# Patient Record
Sex: Female | Born: 1950 | Race: White | Hispanic: No | Smoking: Never smoker
Health system: Southern US, Community
[De-identification: ages and names within clinical notes are randomized; demographics above are authoritative.]

## PROBLEM LIST (undated history)

## (undated) DIAGNOSIS — I10 Essential (primary) hypertension: Secondary | ICD-10-CM

## (undated) DIAGNOSIS — R51 Headache: Secondary | ICD-10-CM

## (undated) HISTORY — PX: CATARACT EXTRACTION: SUR2

## (undated) HISTORY — PX: ROTATOR CUFF REPAIR: SHX139

## (undated) HISTORY — PX: WRIST FRACTURE SURGERY: SHX121

## (undated) HISTORY — PX: CHOLECYSTECTOMY: SHX55

## (undated) HISTORY — PX: ABDOMINOPLASTY: SUR9

## (undated) HISTORY — PX: LAPAROSCOPIC GASTROTOMY W/ REPAIR OF ULCER: SUR772

## (undated) HISTORY — PX: ABDOMINAL HYSTERECTOMY: SHX81

---

## 1997-07-24 ENCOUNTER — Other Ambulatory Visit: Admission: RE | Admit: 1997-07-24 | Discharge: 1997-07-24 | Payer: Self-pay | Admitting: Obstetrics and Gynecology

## 1999-08-19 ENCOUNTER — Other Ambulatory Visit: Admission: RE | Admit: 1999-08-19 | Discharge: 1999-08-19 | Payer: Self-pay | Admitting: Gynecology

## 2002-08-23 ENCOUNTER — Other Ambulatory Visit: Admission: RE | Admit: 2002-08-23 | Discharge: 2002-08-23 | Payer: Self-pay | Admitting: Gynecology

## 2010-06-04 ENCOUNTER — Ambulatory Visit (INDEPENDENT_AMBULATORY_CARE_PROVIDER_SITE_OTHER): Payer: BC Managed Care – PPO | Admitting: Endocrinology

## 2010-06-04 ENCOUNTER — Other Ambulatory Visit: Payer: BC Managed Care – PPO

## 2010-06-04 ENCOUNTER — Encounter: Payer: Self-pay | Admitting: Endocrinology

## 2010-06-04 DIAGNOSIS — G4700 Insomnia, unspecified: Secondary | ICD-10-CM | POA: Insufficient documentation

## 2010-06-04 DIAGNOSIS — R51 Headache: Secondary | ICD-10-CM | POA: Insufficient documentation

## 2010-06-04 DIAGNOSIS — N959 Unspecified menopausal and perimenopausal disorder: Secondary | ICD-10-CM | POA: Insufficient documentation

## 2010-06-04 DIAGNOSIS — R519 Headache, unspecified: Secondary | ICD-10-CM | POA: Insufficient documentation

## 2010-06-04 DIAGNOSIS — E7800 Pure hypercholesterolemia, unspecified: Secondary | ICD-10-CM | POA: Insufficient documentation

## 2010-06-04 DIAGNOSIS — M810 Age-related osteoporosis without current pathological fracture: Secondary | ICD-10-CM

## 2010-06-04 DIAGNOSIS — K279 Peptic ulcer, site unspecified, unspecified as acute or chronic, without hemorrhage or perforation: Secondary | ICD-10-CM | POA: Insufficient documentation

## 2010-06-04 DIAGNOSIS — I10 Essential (primary) hypertension: Secondary | ICD-10-CM | POA: Insufficient documentation

## 2010-06-04 NOTE — Progress Notes (Signed)
  Subjective:    Patient ID: Alisha Becker, female    DOB: May 16, 1950, 60 y.o.   MRN: [id number]  HPI Pt says she was dx'ed with osteoporosis approx 5 year ago.  She took boniva x 2-3 years, but that did not help.  She has been on no other rx so far.   She denies early menopause, multiple myeloma, renal dz, thyroid problems, prolonged bedrest, steroids, alcoholism, smoking hx, liver dz, hereditary syndromes, vid-d deficiency, primary hyperparathyroidism, heparin, anticonvulsants.  She fx left upper arm at age 74, right wrist 4 years ago, and a toe as a teenager.  all of these were with significant injuries.  She says vit-d levels have been normal. Symptomatically, she reports many years of moderate pain at the lower back, and assoc gerd Pmh:  See prob list. Shx: [employer]. Divorced x many years. Fhx:  No osteoporosis  Review of Systems  Constitutional: Negative for unexpected weight change.  Eyes: Negative for visual disturbance.  Respiratory: Negative for shortness of breath.   Cardiovascular: Negative for chest pain.  Gastrointestinal: Negative for anal bleeding.  Skin: Negative for rash.  Neurological: Negative for syncope.  Hematological: Bruises/bleeds easily.   denies falls, cramps, memory loss, sob, fever, weight change, visual loss, and hearing loss     Objective:   Physical Exam VS: see vs page GEN: no distress HEAD: head: no deformity eyes: no periorbital swelling, no proptosis external nose and ears are normal mouth: no lesion seen NECK: supple, thyroid is not enlarged CHEST WALL: no deformity.   Specifically, no kyphosis. CV: reg rate and rhythm, no murmur ABD: abdomen is soft, nontender.  no hepatosplenomegaly.  not distended.  no hernia MUSCULOSKELETAL: muscle bulk and strength are grossly normal.  no obvious joint swelling.  gait is normal and steady EXTEMITIES: no deformity.  no ulcer on the feet.  feet are of normal color and temp.  no edema PULSES: dorsalis  pedis intact bilat.  no carotid bruit NEURO:  cn 2-12 grossly intact.   readily moves all 4's.  sensation is intact to touch on the feet SKIN:  Normal texture and temperature.  No rash or suspicious lesion is visible.   NODES:  None palpable at the neck PSYCH: alert, oriented x3.  Does not appear anxious nor depressed.     Labs: i reviewed dexas Lab Results  Component Value Date   PTH 13.3* 06/04/2010   CALCIUM 9.5 06/04/2010     Assessment & Plan:  Osteoporosis.  No secondary cause is found.   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

## 2010-06-04 NOTE — Patient Instructions (Addendum)
blood tests are being ordered for you today.  please call [phone] to hear your test results. All women are advised to take 1000 mg/day of calcium, and 1000 units/day of vitamin-d. We'll request an injection of "prolia," (a twice a year injection).  We'll call when we are ready to give to you.   Please make a follow-up appointment in 6 months.  (please call 2 weeks prior, so we can do another prior authorization). You could also consider "evista," because it will also reduce your risk of heart attack and breast cancer.

## 2010-06-05 LAB — PTH, INTACT AND CALCIUM: Calcium, Total (PTH): 9.5 mg/dL (ref 8.4–10.5)

## 2010-06-06 ENCOUNTER — Telehealth: Payer: Self-pay | Admitting: *Deleted

## 2010-06-06 LAB — PROTEIN ELECTROPHORESIS, SERUM
Albumin ELP: 58.1 % (ref 55.8–66.1)
Beta Globulin: 7.5 % — ABNORMAL HIGH (ref 4.7–7.2)
Total Protein, Serum Electrophoresis: 7.4 g/dL (ref 6.0–8.3)

## 2010-06-06 NOTE — Telephone Encounter (Signed)
Yes, she took boniva for several years, without improvement.

## 2010-06-06 NOTE — Telephone Encounter (Signed)
I am trying to get Prolia approved for pt.   Has pt failed or is she unable to tolerate at least ONE oral bisphosphanate or has contraindications to oral treatment?  Please advise so I can complete PA form

## 2010-06-07 NOTE — Telephone Encounter (Signed)
Ok, [place] form completed/faxed. Will wait for approval/denial. Copy of benefit summary is at [facility] desk in [employer]. Will in form pt of OOP once PA is complete.

## 2010-06-11 ENCOUNTER — Encounter: Payer: Self-pay | Admitting: Endocrinology

## 2010-06-13 NOTE — Telephone Encounter (Signed)
Left mess for patient to call back To inform PA is approved. Pt's OOP is $30 whether an OV is billed or not.

## 2010-06-17 NOTE — Telephone Encounter (Signed)
Pt informed. She has to check her schedule and call back to schedule nurse visit.

## 2010-06-25 ENCOUNTER — Ambulatory Visit (INDEPENDENT_AMBULATORY_CARE_PROVIDER_SITE_OTHER): Payer: BC Managed Care – PPO

## 2010-06-25 DIAGNOSIS — M810 Age-related osteoporosis without current pathological fracture: Secondary | ICD-10-CM

## 2010-06-25 MED ORDER — DENOSUMAB 60 MG/ML ~~LOC~~ SOLN
60.0000 mg | Freq: Once | SUBCUTANEOUS | Status: AC
  Administered 2010-06-25: 60 mg via SUBCUTANEOUS

## 2010-12-16 ENCOUNTER — Telehealth: Payer: Self-pay | Admitting: Endocrinology

## 2010-12-16 NOTE — Telephone Encounter (Signed)
The pt has scheduled a Proleia shot on Thursday the 15th.  Wanting to order the shot.   Thanks!

## 2010-12-19 ENCOUNTER — Ambulatory Visit: Payer: BC Managed Care – PPO

## 2011-01-17 ENCOUNTER — Ambulatory Visit (INDEPENDENT_AMBULATORY_CARE_PROVIDER_SITE_OTHER): Payer: BC Managed Care – PPO | Admitting: *Deleted

## 2011-01-17 DIAGNOSIS — M810 Age-related osteoporosis without current pathological fracture: Secondary | ICD-10-CM

## 2011-01-17 MED ORDER — DENOSUMAB 60 MG/ML ~~LOC~~ SOLN
60.0000 mg | Freq: Once | SUBCUTANEOUS | Status: AC
  Administered 2011-01-17: 60 mg via SUBCUTANEOUS

## 2011-07-04 ENCOUNTER — Encounter: Payer: Self-pay | Admitting: Endocrinology

## 2011-07-04 ENCOUNTER — Ambulatory Visit (INDEPENDENT_AMBULATORY_CARE_PROVIDER_SITE_OTHER): Payer: BC Managed Care – PPO | Admitting: Endocrinology

## 2011-07-04 VITALS — BP 142/88 | HR 89 | Temp 98.2°F | Ht 61.0 in | Wt 123.0 lb

## 2011-07-04 DIAGNOSIS — M810 Age-related osteoporosis without current pathological fracture: Secondary | ICD-10-CM

## 2011-07-04 MED ORDER — DENOSUMAB 60 MG/ML ~~LOC~~ SOLN
60.0000 mg | Freq: Once | SUBCUTANEOUS | Status: AC
  Administered 2011-07-04: 60 mg via SUBCUTANEOUS

## 2011-07-04 NOTE — Patient Instructions (Signed)
Please sign release of information for your recent blood tests at [facility].  Please return in 1 year.

## 2011-07-04 NOTE — Progress Notes (Signed)
  Subjective:    Patient ID: Alisha Becker, female    DOB: [birth year], 61 y.o.   MRN: [id number]  HPI Pt says she was dx'ed with osteoporosis approx 5 year ago.  She took boniva x 2-3 years, but that did not help.  She is due for her 3rd dose of prolia.    She says there is nothing new in her medical history.  In particular, she has no bony fx. No past medical history on file.  No past surgical history on file.  History   Social History  . Marital Status: Divorced    Spouse Name: N/A    Number of Children: N/A  . Years of Education: N/A   Occupational History  . Not on file.   Social History Main Topics  . Smoking status: Never Smoker   . Smokeless tobacco: Not on file  . Alcohol Use: Not on file  . Drug Use: Not on file  . Sexually Active: Not on file   Other Topics Concern  . Not on file   Social History Narrative  . No narrative on file    Current Outpatient Prescriptions on File Prior to Visit  Medication Sig Dispense Refill  . amitriptyline (ELAVIL) 50 MG tablet Take 50 mg by mouth at bedtime as needed.        . cloNIDine (CATAPRES) 0.1 MG tablet Take 0.1 mg by mouth at bedtime.        Marland Kitchen estradiol (ESTRACE) 1 MG tablet Take 1 mg by mouth daily.        Marland Kitchen losartan-hydrochlorothiazide (HYZAAR) 50-12.5 MG per tablet Take 1 tablet by mouth daily.       . rizatriptan (MAXALT) 10 MG tablet Take 10 mg by mouth as needed. For migraines      . simvastatin (ZOCOR) 20 MG tablet Take 20 mg by mouth at bedtime.        Marland Kitchen venlafaxine (EFFEXOR-XR) 150 MG 24 hr capsule Take 150 mg by mouth 2 (two) times daily.          Allergies  Allergen Reactions  . Codeine Nausea Only    No family history on file.  BP 142/88  Pulse 89  Temp(Src) 98.2 F (36.8 C) (Oral)  Ht 5\' 1"  (1.549 m)  Wt 123 lb (55.792 kg)  BMI 23.24 kg/m2  SpO2 98%    Review of Systems Denies falls    Objective:   Physical Exam VITAL SIGNS:  See vs page GENERAL: no distress MSK: no deformity Gait:  normal and steady       Assessment & Plan:  Osteoporosis, ready for next dose of "prolia."

## 2011-07-22 ENCOUNTER — Other Ambulatory Visit: Payer: Self-pay | Admitting: Obstetrics and Gynecology

## 2011-07-28 ENCOUNTER — Encounter (HOSPITAL_COMMUNITY): Payer: Self-pay | Admitting: Pharmacy Technician

## 2011-08-05 ENCOUNTER — Inpatient Hospital Stay (HOSPITAL_COMMUNITY): Admission: RE | Admit: 2011-08-05 | Payer: BC Managed Care – PPO | Source: Ambulatory Visit

## 2011-08-06 ENCOUNTER — Encounter (HOSPITAL_COMMUNITY): Payer: Self-pay

## 2011-08-06 ENCOUNTER — Encounter (HOSPITAL_COMMUNITY)
Admission: RE | Admit: 2011-08-06 | Discharge: 2011-08-06 | Disposition: A | Discharge status: Home / Self Care | Payer: BC Managed Care – PPO | Source: Ambulatory Visit | Attending: Obstetrics and Gynecology | Admitting: Obstetrics and Gynecology

## 2011-08-06 HISTORY — DX: Essential (primary) hypertension: I10

## 2011-08-06 HISTORY — DX: Headache: R51

## 2011-08-06 LAB — CBC
Hemoglobin: 14.3 g/dL (ref 12.0–15.0)
MCHC: 33.6 g/dL (ref 30.0–36.0)
RBC: 4.68 MIL/uL (ref 3.87–5.11)

## 2011-08-06 LAB — BASIC METABOLIC PANEL
GFR calc Af Amer: >90 mL/min (ref >90)
GFR calc non Af Amer: >90 mL/min (ref >90)
Potassium: 3.6 mEq/L (ref 3.5–5.1)
Sodium: 140 mEq/L (ref 135–145)

## 2011-08-06 NOTE — Patient Instructions (Addendum)
20 [name]  08/06/2011   Your procedure is scheduled on:  08/12/11  Enter through the Main Entrance of [facility] at 1030 AM.  Pick up the phone at the desk and dial [phone].   Call this number if you have problems the morning of surgery: [phone]   Remember:   Do not eat food:After Midnight.  Do not drink clear liquids: After Midnight.  Take these medicines the morning of surgery with A SIP OF WATER: may take Effexor   Do not wear jewelry, make-up or nail polish.  Do not wear lotions, powders, or perfumes. You may wear deodorant.  Do not shave 48 hours prior to surgery.  Do not bring valuables to the hospital.  Contacts, dentures or bridgework may not be worn into surgery.  Leave suitcase in the car. After surgery it may be brought to your room.  For patients admitted to the hospital, checkout time is 11:00 AM the day of discharge.   Patients discharged the day of surgery will not be allowed to drive home.  Name and phone number of your driver: NA  Special Instructions: CHG Shower Use Special Wash: 1/2 bottle night before surgery and 1/2 bottle morning of surgery.   Please read over the following fact sheets that you were given: Surgical Site Infection Prevention

## 2011-08-12 ENCOUNTER — Ambulatory Visit (HOSPITAL_COMMUNITY): Payer: BC Managed Care – PPO | Admitting: Anesthesiology

## 2011-08-12 ENCOUNTER — Encounter (HOSPITAL_COMMUNITY): Payer: Self-pay | Admitting: *Deleted

## 2011-08-12 ENCOUNTER — Ambulatory Visit (HOSPITAL_COMMUNITY)
Admission: RE | Admit: 2011-08-12 | Discharge: 2011-08-13 | Disposition: A | Discharge status: Home / Self Care | Payer: BC Managed Care – PPO | Source: Ambulatory Visit | Attending: Obstetrics and Gynecology | Admitting: Obstetrics and Gynecology

## 2011-08-12 ENCOUNTER — Encounter (HOSPITAL_COMMUNITY): Payer: Self-pay | Admitting: Anesthesiology

## 2011-08-12 ENCOUNTER — Encounter (HOSPITAL_COMMUNITY)
Admission: RE | Disposition: A | Discharge status: Home / Self Care | Payer: Self-pay | Source: Ambulatory Visit | Attending: Obstetrics and Gynecology

## 2011-08-12 DIAGNOSIS — Z90710 Acquired absence of both cervix and uterus: Secondary | ICD-10-CM | POA: Insufficient documentation

## 2011-08-12 DIAGNOSIS — Z01812 Encounter for preprocedural laboratory examination: Secondary | ICD-10-CM | POA: Insufficient documentation

## 2011-08-12 DIAGNOSIS — Z01818 Encounter for other preprocedural examination: Secondary | ICD-10-CM | POA: Insufficient documentation

## 2011-08-12 DIAGNOSIS — N393 Stress incontinence (female) (male): Secondary | ICD-10-CM | POA: Insufficient documentation

## 2011-08-12 HISTORY — PX: BLADDER SUSPENSION: SHX72

## 2011-08-12 HISTORY — PX: CYSTOSCOPY: SHX5120

## 2011-08-12 SURGERY — TRANSVAGINAL TAPE (TVT) PROCEDURE
Anesthesia: General | Site: Vagina | Wound class: Clean Contaminated

## 2011-08-12 MED ORDER — BUTALBITAL-APAP-CAFFEINE 50-325-40 MG PO TABS
1.0000 | ORAL_TABLET | Freq: Three times a day (TID) | ORAL | Status: DC
  Administered 2011-08-12 – 2011-08-13 (×2): 1 via ORAL
  Filled 2011-08-12 (×2): qty 1

## 2011-08-12 MED ORDER — HYDROMORPHONE HCL PF 1 MG/ML IJ SOLN: 0.2500 mg | INTRAMUSCULAR | Status: DC | PRN

## 2011-08-12 MED ORDER — INDIGOTINDISULFONATE SODIUM 8 MG/ML IJ SOLN
INTRAMUSCULAR | Status: DC | PRN
  Administered 2011-08-12: 40 mg via INTRAVENOUS

## 2011-08-12 MED ORDER — FENTANYL CITRATE 0.05 MG/ML IJ SOLN
INTRAMUSCULAR | Status: DC | PRN
  Administered 2011-08-12: 100 ug via INTRAVENOUS

## 2011-08-12 MED ORDER — LIDOCAINE HCL (CARDIAC) 20 MG/ML IV SOLN
INTRAVENOUS | Status: DC | PRN
  Administered 2011-08-12: 100 mg via INTRAVENOUS

## 2011-08-12 MED ORDER — SODIUM CHLORIDE 0.9 % IJ SOLN
INTRAMUSCULAR | Status: DC | PRN
  Administered 2011-08-12: 50 mL

## 2011-08-12 MED ORDER — PHENYLEPHRINE HCL 10 MG/ML IJ SOLN
INTRAMUSCULAR | Status: DC | PRN
  Administered 2011-08-12 (×3): 40 ug via INTRAVENOUS

## 2011-08-12 MED ORDER — KETOROLAC TROMETHAMINE 30 MG/ML IJ SOLN
INTRAMUSCULAR | Status: DC | PRN
  Administered 2011-08-12: 30 mg via INTRAVENOUS

## 2011-08-12 MED ORDER — MIDAZOLAM HCL 2 MG/2ML IJ SOLN
INTRAMUSCULAR | Status: AC
  Filled 2011-08-12: qty 2

## 2011-08-12 MED ORDER — STERILE WATER FOR IRRIGATION IR SOLN
Status: DC | PRN
  Administered 2011-08-12: 1000 mL via INTRAVESICAL

## 2011-08-12 MED ORDER — HYDRALAZINE HCL 20 MG/ML IJ SOLN
INTRAMUSCULAR | Status: AC
  Filled 2011-08-12: qty 1

## 2011-08-12 MED ORDER — DEXAMETHASONE SODIUM PHOSPHATE 10 MG/ML IJ SOLN
INTRAMUSCULAR | Status: AC
  Filled 2011-08-12: qty 1

## 2011-08-12 MED ORDER — ONDANSETRON HCL 4 MG/2ML IJ SOLN
INTRAMUSCULAR | Status: DC | PRN
  Administered 2011-08-12: 4 mg via INTRAVENOUS

## 2011-08-12 MED ORDER — INDIGOTINDISULFONATE SODIUM 8 MG/ML IJ SOLN
INTRAMUSCULAR | Status: AC
  Filled 2011-08-12: qty 5

## 2011-08-12 MED ORDER — ONDANSETRON HCL 4 MG/2ML IJ SOLN
INTRAMUSCULAR | Status: AC
  Filled 2011-08-12: qty 2

## 2011-08-12 MED ORDER — VENLAFAXINE HCL ER 150 MG PO CP24
150.0000 mg | ORAL_CAPSULE | Freq: Every day | ORAL | Status: DC
  Administered 2011-08-12: 150 mg via ORAL
  Filled 2011-08-12 (×2): qty 1

## 2011-08-12 MED ORDER — CLONIDINE HCL 0.1 MG PO TABS
0.1000 mg | ORAL_TABLET | Freq: Every day | ORAL | Status: DC
Start: 2011-08-12 — End: 2011-08-13
  Administered 2011-08-12: 0.1 mg via ORAL
  Filled 2011-08-12 (×2): qty 1

## 2011-08-12 MED ORDER — LACTATED RINGERS IV SOLN
INTRAVENOUS | Status: DC
  Administered 2011-08-12: 11:00:00 via INTRAVENOUS

## 2011-08-12 MED ORDER — PROPOFOL 10 MG/ML IV EMUL
INTRAVENOUS | Status: AC
  Filled 2011-08-12: qty 20

## 2011-08-12 MED ORDER — GLYCOPYRROLATE 0.2 MG/ML IJ SOLN
INTRAMUSCULAR | Status: AC
  Filled 2011-08-12: qty 1

## 2011-08-12 MED ORDER — LIDOCAINE-EPINEPHRINE 1 %-1:100000 IJ SOLN
INTRAMUSCULAR | Status: DC | PRN
  Administered 2011-08-12: 20 mL

## 2011-08-12 MED ORDER — GLYCOPYRROLATE 0.2 MG/ML IJ SOLN
INTRAMUSCULAR | Status: DC | PRN
  Administered 2011-08-12: 0.1 mg via INTRAVENOUS

## 2011-08-12 MED ORDER — VENLAFAXINE HCL ER 150 MG PO CP24
150.0000 mg | ORAL_CAPSULE | Freq: Every day | ORAL | Status: DC
  Filled 2011-08-12: qty 1

## 2011-08-12 MED ORDER — PROPOFOL 10 MG/ML IV BOLUS
INTRAVENOUS | Status: DC | PRN
  Administered 2011-08-12: 200 mg via INTRAVENOUS

## 2011-08-12 MED ORDER — CEFAZOLIN SODIUM-DEXTROSE 2-3 GM-% IV SOLR
2.0000 g | INTRAVENOUS | Status: AC
  Administered 2011-08-12: 2 g via INTRAVENOUS

## 2011-08-12 MED ORDER — LIDOCAINE HCL (CARDIAC) 20 MG/ML IV SOLN
INTRAVENOUS | Status: AC
  Filled 2011-08-12: qty 5

## 2011-08-12 MED ORDER — HYDROMORPHONE HCL 2 MG PO TABS
2.0000 mg | ORAL_TABLET | ORAL | Status: DC | PRN
  Administered 2011-08-12 – 2011-08-13 (×5): 2 mg via ORAL
  Filled 2011-08-12 (×5): qty 1

## 2011-08-12 MED ORDER — MIDAZOLAM HCL 5 MG/5ML IJ SOLN
INTRAMUSCULAR | Status: DC | PRN
  Administered 2011-08-12: 2 mg via INTRAVENOUS

## 2011-08-12 MED ORDER — FENTANYL CITRATE 0.05 MG/ML IJ SOLN
INTRAMUSCULAR | Status: AC
  Filled 2011-08-12: qty 2

## 2011-08-12 MED ORDER — DEXTROSE-NACL 5-0.45 % IV SOLN
INTRAVENOUS | Status: DC
  Administered 2011-08-12 – 2011-08-13 (×2): via INTRAVENOUS

## 2011-08-12 MED ORDER — RIZATRIPTAN BENZOATE 10 MG PO TABS
10.0000 mg | ORAL_TABLET | Freq: Once | ORAL | Status: AC | PRN
  Administered 2011-08-12: 10 mg via ORAL

## 2011-08-12 MED ORDER — DEXAMETHASONE SODIUM PHOSPHATE 10 MG/ML IJ SOLN
INTRAMUSCULAR | Status: DC | PRN
  Administered 2011-08-12: 10 mg via INTRAVENOUS

## 2011-08-12 SURGICAL SUPPLY — 33 items
BLADE SURG 15 STRL LF C SS BP (BLADE) ×2 IMPLANT
BLADE SURG 15 STRL SS (BLADE) ×1
CANISTER SUCTION 2500CC (MISCELLANEOUS) ×3 IMPLANT
CATH FOLEY 2WAY SLVR  5CC 18FR (CATHETERS)
CATH FOLEY 2WAY SLVR 5CC 18FR (CATHETERS) IMPLANT
CATH ROBINSON RED A/P 16FR (CATHETERS) IMPLANT
CLOTH BEACON ORANGE TIMEOUT ST (SAFETY) ×3 IMPLANT
COUNTER NEEDLE 1200 MAGNETIC (NEEDLE) ×3 IMPLANT
DECANTER SPIKE VIAL GLASS SM (MISCELLANEOUS) ×3 IMPLANT
DERMABOND ADHESIVE PROPEN (GAUZE/BANDAGES/DRESSINGS) ×1
DERMABOND ADVANCED .7 DNX6 (GAUZE/BANDAGES/DRESSINGS) ×2 IMPLANT
DRAPE HYSTEROSCOPY (DRAPE) ×3 IMPLANT
GAUZE PACKING 2X5 YD STERILE (GAUZE/BANDAGES/DRESSINGS) IMPLANT
GAUZE PACKING IODOFORM 2 (PACKING) IMPLANT
GLOVE ECLIPSE 7.0 STRL STRAW (GLOVE) ×6 IMPLANT
GOWN PREVENTION PLUS LG XLONG (DISPOSABLE) ×6 IMPLANT
GOWN STRL REIN XL XLG (GOWN DISPOSABLE) ×3 IMPLANT
NEEDLE ASPIRATION INJECT 22GA (NEEDLE) ×3 IMPLANT
NEEDLE SPNL 18GX3.5 QUINCKE PK (NEEDLE) ×3 IMPLANT
NS IRRIG 1000ML POUR BTL (IV SOLUTION) ×3 IMPLANT
PACK VAGINAL MINOR WOMEN LF (CUSTOM PROCEDURE TRAY) ×3 IMPLANT
PACK VAGINAL WOMENS (CUSTOM PROCEDURE TRAY) IMPLANT
PENCIL BUTTON HOLSTER BLD 10FT (ELECTRODE) ×3 IMPLANT
SET CYSTO W/LG BORE CLAMP LF (SET/KITS/TRAYS/PACK) ×3 IMPLANT
SUT VIC AB 2-0 CTB1 (SUTURE) ×6 IMPLANT
SYR 20CC LL (SYRINGE) ×3 IMPLANT
TOWEL OR 17X24 6PK STRL BLUE (TOWEL DISPOSABLE) ×6 IMPLANT
TRAY FOLEY CATH 14FR (SET/KITS/TRAYS/PACK) IMPLANT
TRAY FOLEY METER SIL LF 16FR (CATHETERS) ×3 IMPLANT
TUBING SUCTION BULK 100 FT (MISCELLANEOUS) ×3 IMPLANT
TVT Exact (Sling) ×3 IMPLANT
WATER STERILE IRR 1000ML POUR (IV SOLUTION) ×3 IMPLANT
YANKAUER SUCT BULB TIP NO VENT (SUCTIONS) ×3 IMPLANT

## 2011-08-12 NOTE — Anesthesia Preprocedure Evaluation (Signed)
Anesthesia Evaluation  Patient identified by MRN, date of birth, ID band Patient awake    Reviewed: Allergy & Precautions, H&P , Patient's Chart, lab work & pertinent test results, reviewed documented beta blocker date and time   Airway Mallampati: II TM Distance: >3 FB Neck ROM: full    Dental No notable dental hx.    Pulmonary  breath sounds clear to auscultation  Pulmonary exam normal       Cardiovascular hypertension, Pt. on medications Rhythm:regular Rate:Normal     Neuro/Psych    GI/Hepatic   Endo/Other    Renal/GU      Musculoskeletal   Abdominal   Peds  Hematology   Anesthesia Other Findings   Reproductive/Obstetrics                           Anesthesia Physical Anesthesia Plan  ASA: II  Anesthesia Plan: General   Post-op Pain Management:    Induction: Intravenous  Airway Management Planned: LMA  Additional Equipment:   Intra-op Plan:   Post-operative Plan:   Informed Consent: I have reviewed the patients History and Physical, chart, labs and discussed the procedure including the risks, benefits and alternatives for the proposed anesthesia with the patient or authorized representative who has indicated his/her understanding and acceptance.   Dental Advisory Given  Plan Discussed with: CRNA and Surgeon  Anesthesia Plan Comments: (  Discussed  general anesthesia, including possible nausea, instrumentation of airway, sore throat,pulmonary aspiration, etc. I asked if the were any outstanding questions, or  concerns before we proceeded. )        Anesthesia Quick Evaluation

## 2011-08-12 NOTE — Anesthesia Postprocedure Evaluation (Signed)
  Anesthesia Post-op Note  Patient: Alisha Becker  Procedure(s) Performed: Procedure(s) (LRB): TRANSVAGINAL TAPE (TVT) PROCEDURE (N/A) CYSTOSCOPY (N/A)  Patient Location: PACU  Anesthesia Type: General  Level of Consciousness: awake, alert  and oriented  Airway and Oxygen Therapy: Patient Spontanous Breathing  Post-op Pain: none  Post-op Assessment: Post-op Vital signs reviewed, Patient's Cardiovascular Status Stable, Respiratory Function Stable, Patent Airway, No signs of Nausea or vomiting and Pain level controlled  Post-op Vital Signs: Reviewed and stable  Complications: No apparent anesthesia complications

## 2011-08-12 NOTE — Transfer of Care (Signed)
Immediate Anesthesia Transfer of Care Note  Patient: Alisha Becker  Procedure(s) Performed: Procedure(s) (LRB): TRANSVAGINAL TAPE (TVT) PROCEDURE (N/A) CYSTOSCOPY (N/A)  Patient Location: PACU  Anesthesia Type: General  Level of Consciousness: awake, alert  and oriented  Airway & Oxygen Therapy: Patient Spontanous Breathing  Post-op Assessment: Report given to PACU RN and Post -op Vital signs reviewed and stable  Post vital signs: Reviewed and stable  Complications: No apparent anesthesia complications

## 2011-08-12 NOTE — Op Note (Signed)
NAMELOYAL, RUDY NO.:  [id number]  MEDICAL RECORD NO.:  [id number]  LOCATION:  9309                          FACILITY:  [facility]  PHYSICIAN:  Malva Limes, M.D.    DATE OF BIRTH:  Nov 30, 1950  DATE OF PROCEDURE:  08/12/2011 DATE OF DISCHARGE:                              OPERATIVE REPORT   PREOPERATIVE DIAGNOSIS:  Stress urinary incontinence.  POSTOPERATIVE DIAGNOSIS:  Stress urinary incontinence.  PROCEDURE: 1. TVT urethropexy. 2. Cystoscopy.  SURGEON:  Malva Limes, MD  ANESTHESIA:  General with local.  ANTIBIOTICS:  Ancef 2 g.  ESTIMATED BLOOD LOSS:  30 mL.  SPECIMENS:  None.  COMPLICATIONS:  None.  PROCEDURE:  The patient was taken to the operating room, where she was placed in the dorsal supine position.  A general anesthetic was administered without difficulty.  She was then placed in dorsal lithotomy position.  She was prepped and draped in the usual fashion for this procedure.  A Foley catheter was placed into her bladder.  Her suprapubic region was marked for exit sites of the TVT Exact.  Following this, the suprapubic region was hydrodissected with 30 mL of a dilute lidocaine solution on each side and also in the anterior vagina.  At this point, a vertical skin incision was made in the anterior vaginal wall approximately 1 cm from the urethral meatus.  At this point, the Foley catheter was removed and replaced with a larger Foley with a metal rod, diverting rod placed through it.  At this point, passage of the TVT Exact was begun on the patient's right.  It was advanced through the incision in the vagina and then into the retropubic space exiting through the skin.  After this, the urethra and bladder were diverted in the opposite direction and the patient's left side at the TVT Exact placed through the retropubic region.  At this point, Foley catheter was removed.  Cystoscopy was performed.  The entire bladder was visualized. There was  no evidence of foreign material in the bladder.  Bladder dome was seen.  Both ureters were seen and were functioning properly.  At this point, the cystoscope was removed.  The protective covering on the mesh was then removed.  The TVT tape was tightened with a Kelly between the mesh and the urethra.  Excess mesh was trimmed off.  Incisions in the suprapubic region were closed with Dermabond.  The vagina was closed using 2-0 Vicryl in a running, locking fashion.  The patient was then awakened and taken to recovery room in stable condition.  She did have a Foley catheter placed.  Instrument and lap counts were correct x1.  The patient will be admitted overnight for observation.          ______________________________ Malva Limes, M.D.     MA/MEDQ  D:  08/12/2011  T:  08/12/2011  Job:  [id number]

## 2011-08-12 NOTE — OR Nursing (Signed)
1228 TVT Exact sling implanted @ midurethral position in  OR suite per Dr. Judie Petit. [place]. Serial # [id number]. Exp. Date 04-03-12.

## 2011-08-12 NOTE — H&P (Signed)
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 Pt has no symptoms of urge incontinence. She understands the risks and possible complications of this procedure. She has no cystocele and normal bowel function.  PMHX: Allergy-codeine             Surgery- TVH, SVD x 2             Smoke-no, Drugs-no , meds- see list PE- VSSAF         HEENT-wnl        ABD- soft, non tender, no masses IMP/ SUI PLAN/ Proceed with TVT

## 2011-08-12 NOTE — Anesthesia Procedure Notes (Signed)
Procedure Name: LMA Insertion Date/Time: 08/12/2011 12:11 PM Performed by: Wyat Infinger, Jannet Askew Pre-anesthesia Checklist: Patient identified, Timeout performed, Emergency Drugs available, Suction available and Patient being monitored Patient Re-evaluated:Patient Re-evaluated prior to inductionOxygen Delivery Method: Circle system utilized Preoxygenation: Pre-oxygenation with 100% oxygen Intubation Type: IV induction LMA: LMA inserted LMA Size: 4.0 Number of attempts: 1 Tube secured with: Tape (at lip) Dental Injury: Teeth and Oropharynx as per pre-operative assessment

## 2011-08-13 ENCOUNTER — Encounter (HOSPITAL_COMMUNITY): Payer: Self-pay | Admitting: Obstetrics and Gynecology

## 2011-08-13 NOTE — Progress Notes (Signed)
Pt. Is discharged in the care of son per ambulatory. Stable. Denies any further pain or discomfort.Marland Kitchen Spirits are good Supra pubic  Sites are clean and dry.  Denies vaginal bleeding. Understands all discharge instructions well. Questions asked and answered.

## 2011-08-13 NOTE — Discharge Summary (Signed)
Alisha Becker, Alisha Becker NO.:  [id number]  MEDICAL RECORD NO.:  [id number]  LOCATION:  9309                          FACILITY:  [facility]  PHYSICIAN:  Malva Limes, M.D.    DATE OF BIRTH:  29-Feb-1950  DATE OF ADMISSION:  08/12/2011 DATE OF DISCHARGE:  08/14/2011                              DISCHARGE SUMMARY   DISCHARGE DIAGNOSIS:  Stress urinary incontinence.  PRINCIPLE PROCEDURES:  PVT urethropexy.  HISTORY OF PRESENT ILLNESS:  [name] is a 62 year old white female, G3, P2, status post TVH who presented to [facility] on August 13, 2011, for urethral sling secondary to a long history of classic stress urinary incontinence.  HOSPITAL COURSE:  The patient was taken to the operating room on August 13, 2011, where a TVT Exact was placed.  Cystoscopy was performed.  The patient did well.  A complete description of this procedure can be found in dictated operative note.  Postoperatively, the patient did well.  At the time of discharge, she had adequate pain relief.  Her incision appeared to be healing well.  She was ambulating without difficulty. She had a voiding trial, which she passed.  The patient was discharged home with Dilaudid.  She was instructed to follow up in the office in 2 weeks.  She was told to call the office with any voiding difficulties, fever, or chills.          ______________________________ Malva Limes, M.D.     MA/MEDQ  D:  08/13/2011  T:  08/13/2011  Job:  [id number]

## 2011-08-13 NOTE — Progress Notes (Signed)
POD#1 Pt c/o discomfort from surgery. Tolerating diet, VSSAF IMP/ stable Plan/ Will d/c foley and start voiding trial.

## 2011-08-13 NOTE — Anesthesia Postprocedure Evaluation (Signed)
  Anesthesia Post-op Note  Patient: Alisha Becker  Procedure(s) Performed: Procedure(s) (LRB): TRANSVAGINAL TAPE (TVT) PROCEDURE (N/A) CYSTOSCOPY (N/A)  Patient Location: [facility] Unit  Anesthesia Type: General  Level of Consciousness: awake  Airway and Oxygen Therapy: Patient Spontanous Breathing  Post-op Pain: none  Post-op Assessment: Patient's Cardiovascular Status Stable and Respiratory Function Stable  Post-op Vital Signs: Reviewed and stable  Complications: No apparent anesthesia complications

## 2011-08-13 NOTE — Addendum Note (Signed)
Addendum  created 08/13/11 1024 by Suella Grove, CRNA   Modules edited:Notes Section

## 2011-12-19 ENCOUNTER — Ambulatory Visit: Payer: BC Managed Care – PPO | Admitting: Endocrinology

## 2012-01-09 ENCOUNTER — Ambulatory Visit (INDEPENDENT_AMBULATORY_CARE_PROVIDER_SITE_OTHER): Payer: BC Managed Care – PPO

## 2012-01-09 DIAGNOSIS — M810 Age-related osteoporosis without current pathological fracture: Secondary | ICD-10-CM

## 2012-01-12 MED ORDER — DENOSUMAB 60 MG/ML ~~LOC~~ SOLN: 60.0000 mg | Freq: Once | SUBCUTANEOUS | Status: DC

## 2012-10-05 ENCOUNTER — Telehealth: Payer: Self-pay | Admitting: Endocrinology

## 2012-10-05 NOTE — Telephone Encounter (Signed)
Left messge pt to make appt for injection

## 2012-10-15 ENCOUNTER — Ambulatory Visit: Payer: BC Managed Care – PPO

## 2012-10-15 ENCOUNTER — Telehealth: Payer: Self-pay

## 2012-10-15 NOTE — Telephone Encounter (Signed)
Pt would came in for Prolia injection today would like to know when she should have another bone scan and can she have it done in [place]

## 2012-10-15 NOTE — Telephone Encounter (Signed)
Left message

## 2012-10-15 NOTE — Telephone Encounter (Signed)
please call patient: When you have your next prolia shot, please have dr appt here the same day.

## 2013-05-23 ENCOUNTER — Ambulatory Visit (INDEPENDENT_AMBULATORY_CARE_PROVIDER_SITE_OTHER): Payer: BC Managed Care – PPO | Admitting: Endocrinology

## 2013-05-23 ENCOUNTER — Encounter: Payer: Self-pay | Admitting: Endocrinology

## 2013-05-23 VITALS — BP 142/88 | HR 75 | Temp 98.0°F | Ht 61.0 in | Wt 120.0 lb

## 2013-05-23 DIAGNOSIS — M25559 Pain in unspecified hip: Secondary | ICD-10-CM

## 2013-05-23 DIAGNOSIS — M810 Age-related osteoporosis without current pathological fracture: Secondary | ICD-10-CM

## 2013-05-23 DIAGNOSIS — M25552 Pain in left hip: Secondary | ICD-10-CM

## 2013-05-23 DIAGNOSIS — M25549 Pain in joints of unspecified hand: Secondary | ICD-10-CM

## 2013-05-23 DIAGNOSIS — M25579 Pain in unspecified ankle and joints of unspecified foot: Secondary | ICD-10-CM

## 2013-05-23 DIAGNOSIS — M25572 Pain in left ankle and joints of left foot: Secondary | ICD-10-CM

## 2013-05-23 DIAGNOSIS — M25542 Pain in joints of left hand: Secondary | ICD-10-CM

## 2013-05-23 NOTE — Patient Instructions (Signed)
blood tests are being requested for you today.  We'll contact you with results. [facility]  you will receive a phone call, about a day and time for an appointment. We'll call you when we receive the prior authorization for prolia.   Please return in 1 year.

## 2013-05-23 NOTE — Progress Notes (Signed)
Subjective:    Patient ID: Alisha Becker, female    DOB: Dec 09, 1950, 63 y.o.   MRN: [id number]  HPI Pt returns fopr f/u of osteoporosis (dx'ed 2007; she took boniva x from 2008-2010, but that did not help; she started prolia in 2011, and is still on it; she had fx toe as a child, and left humerus in 1980 (both with injuries); she has no history of any of the following: early menopause, multiple myeloma, renal dz, thyroid problems, prolonged bedrest, steroids, alcoholism, smoking, liver dz, hereditary syndromes, vid-d deficiency, primary hyperparathyroidism, heparin, or anticonvulsants; fhx is neg for osteoporosis).  Pt says she got her last prolia injection in 2014, despite no record in EMR of this.  Pt reports moderate pain at the left hip, and assoc pain at the left ankle. Past Medical History  Diagnosis Date  . Hypertension   . YIFOYDXA(128.7)     Past Surgical History  Procedure Laterality Date  . Vaginal delivery      x2  . Wrist fracture surgery    . Laparoscopic gastrotomy w/ repair of ulcer    . Abdominoplasty    . Bladder suspension  08/12/2011    Procedure: TRANSVAGINAL TAPE (TVT) PROCEDURE;  Surgeon: Olga Millers, MD;  Location: [facility];  Service: Gynecology;  Laterality: N/A;  . Cystoscopy  08/12/2011    Procedure: CYSTOSCOPY;  Surgeon: Olga Millers, MD;  Location: [facility];  Service: Gynecology;  Laterality: N/A;    History   Social History  . Marital Status: Divorced    Spouse Name: N/A    Number of Children: N/A  . Years of Education: N/A   Occupational History  . Not on file.   Social History Main Topics  . Smoking status: Never Smoker   . Smokeless tobacco: Not on file  . Alcohol Use: Yes     Comment: occasionally  . Drug Use: No  . Sexual Activity: Not on file   Other Topics Concern  . Not on file   Social History Narrative  . No narrative on file    Current Outpatient Prescriptions on File Prior to Visit  Medication Sig Dispense Refill  .  amitriptyline (ELAVIL) 50 MG tablet Take 50 mg by mouth at bedtime.       Marland Kitchen BIOTIN PO Take 10,000 mcg by mouth 3 (three) times daily.      . Calcium-Vitamin D-Vitamin K (VIACTIV) [phone] MG-UNT-MCG CHEW Chew 1 each by mouth 3 (three) times daily.      . cholecalciferol (VITAMIN D) 1000 UNITS tablet Take 1,000 Units by mouth 3 (three) times daily.      . cloNIDine (CATAPRES) 0.1 MG tablet Take 0.1 mg by mouth at bedtime.        . COLLAGEN PO Take 750 mg by mouth 3 (three) times daily.      . Cyanocobalamin (VITAMIN B-12) 5000 MCG SUBL Place 1 tablet under the tongue daily.      Marland Kitchen estradiol (ESTRACE) 1 MG tablet Take 1 mg by mouth daily.        . Evening Primrose Oil 1000 MG CAPS Take 1 capsule by mouth daily.      Marland Kitchen losartan-hydrochlorothiazide (HYZAAR) 50-12.5 MG per tablet Take 1 tablet by mouth daily.       . Magnesium 400 MG CAPS Take 1 capsule by mouth 3 (three) times daily.      . Multiple Vitamin (MULTIVITAMIN WITH MINERALS) TABS Take 0.5 tablets by mouth 2 (two) times  daily.      Marland Kitchen OVER THE COUNTER MEDICATION Take 1 tablet by mouth daily. LUTIN supplement      . PATADAY 0.2 % SOLN Place 1 drop into both eyes daily.       Marland Kitchen venlafaxine (EFFEXOR-XR) 150 MG 24 hr capsule Take 150 mg by mouth daily.       . vitamin C (ASCORBIC ACID) 500 MG tablet Take 500 mg by mouth daily.      . Coenzyme Q10 (COQ10) 100 MG CAPS Take 1 capsule by mouth daily.       Current Facility-Administered Medications on File Prior to Visit  Medication Dose Route Frequency Provider Last Rate Last Dose  . denosumab (PROLIA) injection 60 mg  60 mg Subcutaneous Once Renato Shin, MD        Allergies  Allergen Reactions  . Aspirin Other (See Comments)    Pt has history of ulcers  . Codeine Nausea Only  . Latex Rash    No family history on file.  BP 142/88  Pulse 75  Temp(Src) 98 F (36.7 C) (Oral)  Ht _0  (1.549 m)  Wt 120 lb (54.432 kg)  BMI 22.69 kg/m2  SpO2 92%  Review of Systems Denies falls  and LOC.    Objective:   Physical Exam VITAL SIGNS:  See vs page GENERAL: no distress MSK: no deformity.  Gait is normal and steady.  Hands: bilat heberden's nodes at the dip's. Left heel: large spur.       Assessment & Plan:  Osteoporosis: she is due for recheck of her DEXA. Arthralgias: uncertain etiology.  Not related to osteoporosis.

## 2013-05-24 LAB — SEDIMENTATION RATE: Sed Rate: 15 mm/hr (ref 0–22)

## 2013-05-24 LAB — TSH: TSH: 1.37 u[IU]/mL (ref 0.35–5.50)

## 2013-05-25 LAB — PTH, INTACT AND CALCIUM
Calcium: 9.6 mg/dL (ref 8.4–10.5)
PTH: 21.5 pg/mL (ref 14.0–72.0)

## 2013-05-25 LAB — VITAMIN D 25 HYDROXY (VIT D DEFICIENCY, FRACTURES): VIT D 25 HYDROXY: 54 ng/mL (ref 30–89)

## 2013-05-26 ENCOUNTER — Telehealth: Payer: Self-pay | Admitting: Endocrinology

## 2013-05-26 NOTE — Telephone Encounter (Signed)
Patient would like a copy of her last labs mailed to her. Thanks!

## 2013-05-26 NOTE — Telephone Encounter (Signed)
Labs mailed per pt's request.

## 2013-06-03 ENCOUNTER — Telehealth: Payer: Self-pay | Admitting: Endocrinology

## 2013-06-03 NOTE — Telephone Encounter (Signed)
Sent request for insurance verification to [employer].  I will notify you once I have a response from them. Thank you.

## 2013-06-14 NOTE — Telephone Encounter (Signed)
I received a response back from [employer] and patient's insurance requires a prior authorization.  I will request [employer] to handle the P/A, but they will ask for clinical notes so if you could go ahead and fax over the clinicals I will get them started on the P/A.  They said turn around time is 72 hours.  You can fax clinicals to me at [phone] and please be sure to put to my attn.  Thank you!

## 2013-06-17 NOTE — Telephone Encounter (Signed)
Would that be from the visit of 05/23/2013? I can print if I know which office visit to print. Thank you.

## 2013-06-17 NOTE — Telephone Encounter (Signed)
Yes, ov on 05/23/13. Thank you for your hard work.

## 2013-06-17 NOTE — Telephone Encounter (Signed)
Rose, are you able to go into the patients chart and print her last ov note from her visit with Dr Everardo AllEllison? If not, then I will fax it to you.

## 2013-06-17 NOTE — Telephone Encounter (Signed)
Ok, I have printed that and am waiting on [employer] to send the P/A form which will need to be signed by Dr. Rennis HardingEllis. I will get it to you as soon as I receive.  And thank you for all you do.

## 2013-07-06 ENCOUNTER — Telehealth: Payer: Self-pay | Admitting: Endocrinology

## 2013-07-06 NOTE — Telephone Encounter (Signed)
Faxed [employer]'s prior authorization form to you for Dr. Everardo All to complete. Return to me at [phone] once complete. Thank you so much!

## 2013-07-13 NOTE — Telephone Encounter (Signed)
Faxed completed [employer] prior auth form and clinicals to [facility] for approval 07/12/2013. Thank you.

## 2013-07-13 NOTE — Telephone Encounter (Signed)
I faxed the copies to you but the line was busy. It went into re-dial mode, so if you don't get them, please let me know. Thank you.

## 2013-07-13 NOTE — Telephone Encounter (Signed)
Will the pt need to pay a co-pay? Please advise.

## 2013-07-13 NOTE — Telephone Encounter (Signed)
Can you fax me a copy of that? [phone]. Thank you.

## 2013-07-13 NOTE — Telephone Encounter (Signed)
If an office visit is billed patient will have a $70 co-pay; w/out an office visit Prolia is covered at 100% of the contracted rate. I have made a copy of this info and sent to be scanned into pt's chart.  However, we do not have authorization yet and are waiting on that. If you have any questions please let me know. Thanks!

## 2013-07-27 ENCOUNTER — Telehealth: Payer: Self-pay | Admitting: *Deleted

## 2013-07-27 NOTE — Telephone Encounter (Signed)
Called pt and lvm advising her that her Prolia inj has been approved. She can call our office and schedule this at her convenience.

## 2013-08-01 ENCOUNTER — Telehealth: Payer: Self-pay

## 2013-08-01 NOTE — Telephone Encounter (Signed)
i need the report back to read numbers, thank you

## 2013-08-01 NOTE — Telephone Encounter (Signed)
Called pt and advised of bone density results. Pt wanted to know specifically how the prolia injections were helping and how significant the number has improved. Please advise, Thanks!

## 2013-08-02 ENCOUNTER — Ambulatory Visit (INDEPENDENT_AMBULATORY_CARE_PROVIDER_SITE_OTHER): Payer: BC Managed Care – PPO

## 2013-08-02 DIAGNOSIS — M810 Age-related osteoporosis without current pathological fracture: Secondary | ICD-10-CM

## 2013-08-02 MED ORDER — DENOSUMAB 60 MG/ML ~~LOC~~ SOLN
60.0000 mg | Freq: Once | SUBCUTANEOUS | Status: AC
  Administered 2013-08-02: 60 mg via SUBCUTANEOUS

## 2013-08-02 NOTE — Telephone Encounter (Signed)
Results placed on desk

## 2013-08-02 NOTE — Telephone Encounter (Signed)
please call patient: 2.5 improved to 2.3 1.8 improved to 1.4 0.7 improved to 0.4 (lower is better, as these numbers are how much lower than normal your bone-density is)

## 2013-08-04 NOTE — Telephone Encounter (Signed)
Noted pt. advised

## 2013-10-04 DIAGNOSIS — L84 Corns and callosities: Secondary | ICD-10-CM

## 2014-02-20 ENCOUNTER — Telehealth: Payer: Self-pay | Admitting: *Deleted

## 2014-02-20 NOTE — Telephone Encounter (Signed)
Rose, will you initiate a PA for Prolia for this pt? Thank you.

## 2014-02-23 NOTE — Telephone Encounter (Signed)
Could you please get updated insurance info for pt? [facility] 08/02/2013. Thank you.

## 2014-03-03 ENCOUNTER — Encounter: Payer: Self-pay | Admitting: *Deleted

## 2014-03-03 NOTE — Telephone Encounter (Signed)
Lvm for pt to call (on 02/24/14) and let us know about her insurance. Pt did not return call. Letter out today to pt.

## 2014-04-04 ENCOUNTER — Telehealth: Payer: Self-pay | Admitting: *Deleted

## 2014-04-04 NOTE — Telephone Encounter (Signed)
Her new ins information is in the system now. Please move forward with doing the PA for Prolia. Thank you.

## 2014-04-05 NOTE — Telephone Encounter (Signed)
I have sent pt's info for Prolia insurance verification and will notify you once I have a response. Thank you. °

## 2014-04-05 NOTE — Telephone Encounter (Signed)
Opened this encounter in error.

## 2014-04-20 NOTE — Telephone Encounter (Signed)
Pt's [employer] is requiring a prior auth for Prolia.  I have completed part of the form, but the other part needs to be completed by Dr. Everardo AllEllison and signed.  I have faxed the form to your attn at [phone]. Once it is completed/signed you can return via fax to me at [phone]. Thank you.

## 2014-05-09 ENCOUNTER — Ambulatory Visit (INDEPENDENT_AMBULATORY_CARE_PROVIDER_SITE_OTHER): Payer: Medicare PPO | Admitting: Endocrinology

## 2014-05-09 ENCOUNTER — Encounter: Payer: Self-pay | Admitting: Endocrinology

## 2014-05-09 VITALS — BP 136/84 | HR 78 | Temp 97.8°F | Ht 61.0 in | Wt 118.0 lb

## 2014-05-09 DIAGNOSIS — M810 Age-related osteoporosis without current pathological fracture: Secondary | ICD-10-CM | POA: Diagnosis not present

## 2014-05-09 NOTE — Progress Notes (Signed)
Subjective:    Patient ID: Alisha Becker, female    DOB: August 15, 1950, 63 y.o.   MRN: [id number]  HPI Pt returns for f/u of osteoporosis (dx'ed 2007; she took boniva x from 2008-2010, but that did not help; she started prolia in 2011, and is still on it; she had fx toe as a child, and left humerus in 1980 (both with injuries); she has no history of any of the following: early menopause, multiple myeloma, renal dz, thyroid problems, prolonged bedrest, steroids, alcoholism, smoking, liver dz, hereditary syndromes, vid-d deficiency, primary hyperparathyroidism, heparin, or anticonvulsants; fhx is neg for osteoporosis).  pt states she feels well in general. No recent bony fx. She has tolerated prolia well.   Past Medical History  Diagnosis Date  . Hypertension   . [id number](829.9)     Past Surgical History  Procedure Laterality Date  . Vaginal delivery      x2  . Wrist fracture surgery    . Laparoscopic gastrotomy w/ repair of ulcer    . Abdominoplasty    . Bladder suspension  08/12/2011    Procedure: TRANSVAGINAL TAPE (TVT) PROCEDURE;  Surgeon: Olga Millers, MD;  Location: [facility];  Service: Gynecology;  Laterality: N/A;  . Cystoscopy  08/12/2011    Procedure: CYSTOSCOPY;  Surgeon: Olga Millers, MD;  Location: [facility];  Service: Gynecology;  Laterality: N/A;    History   Social History  . Marital Status: Divorced    Spouse Name: N/A  . Number of Children: N/A  . Years of Education: N/A   Occupational History  . Not on file.   Social History Main Topics  . Smoking status: Never Smoker   . Smokeless tobacco: Not on file  . Alcohol Use: Yes     Comment: occasionally  . Drug Use: No  . Sexual Activity: Not on file   Other Topics Concern  . Not on file   Social History Narrative    Current Outpatient Prescriptions on File Prior to Visit  Medication Sig Dispense Refill  . amitriptyline (ELAVIL) 50 MG tablet Take 50 mg by mouth at bedtime.     Marland Kitchen BIOTIN PO Take 10,000 mcg  by mouth 3 (three) times daily.    . butalbital-acetaminophen-caffeine (FIORICET, ESGIC) 50-325-40 MG per tablet     . cholecalciferol (VITAMIN D) 1000 UNITS tablet Take 1,000 Units by mouth 3 (three) times daily.    . cloNIDine (CATAPRES) 0.1 MG tablet Take 0.1 mg by mouth at bedtime.      . rizatriptan (MAXALT) 5 MG tablet     . Cyanocobalamin (VITAMIN B-12) 5000 MCG SUBL Place 1 tablet under the tongue daily.    Marland Kitchen estradiol (ESTRACE) 1 MG tablet Take 1 mg by mouth daily.      . Multiple Vitamin (MULTIVITAMIN WITH MINERALS) TABS Take 0.5 tablets by mouth 2 (two) times daily.    Marland Kitchen OVER THE COUNTER MEDICATION Take 1 tablet by mouth daily. LUTIN supplement    . PATADAY 0.2 % SOLN Place 1 drop into both eyes daily.      Current Facility-Administered Medications on File Prior to Visit  Medication Dose Route Frequency Provider Last Rate Last Dose  . denosumab (PROLIA) injection 60 mg  60 mg Subcutaneous Once Renato Shin, MD        Allergies  Allergen Reactions  . Aspirin Other (See Comments)    Pt has history of ulcers  . Codeine Nausea Only  . Latex Rash  No family history on file.  BP 136/84 mmHg  Pulse 78  Temp(Src) 97.8 F (36.6 C) (Oral)  Ht _0  (1.549 m)  Wt 118 lb (53.524 kg)  BMI 22.31 kg/m2  SpO2 96%  Review of Systems She denies falls.      Objective:   Physical Exam VITAL SIGNS:  See vs page.   GENERAL: no distress.  NECK: There is no palpable thyroid enlargement.  No thyroid nodule is palpable.  No palpable lymphadenopathy at the anterior neck. Spine: no kyphosis.  Gait: normal and steady Neuro: no tremor.   Radiol: i reviewed 2015 dexa result.     Assessment & Plan:  Osteoporosis, persistent  Patient is advised the following: Patient Instructions  blood tests are requested for you today.  We'll let you know about the results. it is critically important to prevent falling down (keep floor areas well-lit, dry, and free of loose objects.  If you  have a cane, walker, or wheelchair, you should use it, even for short trips around the house.  Also, try not to rush). Please return in 1 year.  addendum: i have done the prolia PA form

## 2014-05-09 NOTE — Patient Instructions (Addendum)
blood tests are requested for you today.  We'll let you know about the results. it is critically important to prevent falling down (keep floor areas well-lit, dry, and free of loose objects.  If you have a cane, walker, or wheelchair, you should use it, even for short trips around the house.  Also, try not to rush). Please return in 1 year.

## 2014-05-15 NOTE — Telephone Encounter (Signed)
I have sent the completed [employer] p/a form along w/clinicals from 05/23/2013 and Dexa scan from 06/30/2013 [facility] and will notify you once I have a response. Thank you.

## 2014-06-15 ENCOUNTER — Telehealth: Payer: Self-pay | Admitting: *Deleted

## 2014-06-15 ENCOUNTER — Telehealth: Payer: Self-pay | Admitting: Endocrinology

## 2014-06-15 NOTE — Telephone Encounter (Signed)
Rose, just wanted to check in and see if you received an approval for Ms Picha's Prolia inj yet? Please advise. Thank you.

## 2014-06-15 NOTE — Telephone Encounter (Signed)
Pt states it is time for her prolia shot please advise on fu

## 2014-06-16 NOTE — Telephone Encounter (Signed)
Left voicemail advising patient we are waiting on the PA to come back for the Prolia injection. Once we get the PA response we will be able to proceed with scheduling the injection. Requested call back if patient would like to discuss.

## 2014-06-19 NOTE — Telephone Encounter (Signed)
I have the prior auth from [facility] and the verification from [facility], however, there is an error on [employer]'s verirfication and I have been waiting for them to contact me back.  I will call them once they open and let you know what I find out.  Thank you.

## 2014-06-27 NOTE — Telephone Encounter (Signed)
I have finally gotten the correct insurance verification from [employer] for Ms. Hing's injection.  [employer] also required a prior authorization, which I have already obtained.  The p/a has been approved for 05/15/2014 to 05/14/2016.  I have sent the prior auth to be scanned into pt's chart for future references.  If no OV is billed admin and Prolia will be covered at 100% of contracted rate, which means pt will have an estimated responsibility of $0; if an OV is billed pt will have an estimated responsibility of a $20 co-pay.  Please make patient aware these are estimates and we will not know an exact amt until her insurance pays.  I have also sent a copy of the summary of benefits to be scanned into pt's chart.  If you have any questions please let me know. Thank you.

## 2014-06-28 ENCOUNTER — Ambulatory Visit (INDEPENDENT_AMBULATORY_CARE_PROVIDER_SITE_OTHER): Payer: Medicare PPO | Admitting: *Deleted

## 2014-06-28 DIAGNOSIS — M810 Age-related osteoporosis without current pathological fracture: Secondary | ICD-10-CM | POA: Diagnosis not present

## 2014-06-28 MED ORDER — DENOSUMAB 60 MG/ML ~~LOC~~ SOLN
60.0000 mg | Freq: Once | SUBCUTANEOUS | Status: AC
  Administered 2014-06-28: 60 mg via SUBCUTANEOUS

## 2014-07-28 DIAGNOSIS — Z1231 Encounter for screening mammogram for malignant neoplasm of breast: Secondary | ICD-10-CM | POA: Diagnosis not present

## 2014-08-02 DIAGNOSIS — K648 Other hemorrhoids: Secondary | ICD-10-CM | POA: Diagnosis not present

## 2014-08-02 DIAGNOSIS — G43909 Migraine, unspecified, not intractable, without status migrainosus: Secondary | ICD-10-CM | POA: Diagnosis not present

## 2014-08-02 DIAGNOSIS — Z1211 Encounter for screening for malignant neoplasm of colon: Secondary | ICD-10-CM | POA: Diagnosis not present

## 2014-08-02 DIAGNOSIS — Z86010 Personal history of colonic polyps: Secondary | ICD-10-CM | POA: Diagnosis not present

## 2014-08-02 DIAGNOSIS — I10 Essential (primary) hypertension: Secondary | ICD-10-CM | POA: Diagnosis not present

## 2014-08-02 DIAGNOSIS — J301 Allergic rhinitis due to pollen: Secondary | ICD-10-CM | POA: Diagnosis not present

## 2014-08-02 DIAGNOSIS — K5730 Diverticulosis of large intestine without perforation or abscess without bleeding: Secondary | ICD-10-CM | POA: Diagnosis not present

## 2014-08-02 DIAGNOSIS — E785 Hyperlipidemia, unspecified: Secondary | ICD-10-CM | POA: Diagnosis not present

## 2014-08-02 DIAGNOSIS — Z8371 Family history of colonic polyps: Secondary | ICD-10-CM | POA: Diagnosis not present

## 2014-11-23 DIAGNOSIS — L259 Unspecified contact dermatitis, unspecified cause: Secondary | ICD-10-CM | POA: Diagnosis not present

## 2014-11-30 ENCOUNTER — Telehealth: Payer: Self-pay | Admitting: *Deleted

## 2014-11-30 NOTE — Telephone Encounter (Signed)
Rose, will you initiate a PA for the Prolia inj at your convenience? Pt is to have next inj around 12/30/14. Thank you so much!

## 2014-12-05 NOTE — Telephone Encounter (Signed)
I have electronically submitted pt's info for Prolia insurance verification and will notify you once I have a response. Thank you. °

## 2014-12-07 NOTE — Telephone Encounter (Signed)
I have rec'd pt's authorization for Prolia injection.  [employer] req'd a prior auth, however, there is one on file which is eff 05/15/2014-05/14/2016 w/an Berkley Harveyauth #[id number].  If an OV is billed pt will have an estimated responsibility of a $40 co-pay; w/out an OV Prolia and admin will be covered 100% of the contracted rate leaving pt w/an estimated responsibility of $0.  Please advise pt this is an estimate and we will not know an exact amt until her insurance has paid.  If you have any questions, please let me know. Thank you.  Also, please send me the date of actual injection once pt rec's it so I can update the Prolia portal.  Thank you.

## 2015-01-02 ENCOUNTER — Ambulatory Visit (INDEPENDENT_AMBULATORY_CARE_PROVIDER_SITE_OTHER): Payer: Medicare PPO

## 2015-01-02 DIAGNOSIS — M810 Age-related osteoporosis without current pathological fracture: Secondary | ICD-10-CM | POA: Diagnosis not present

## 2015-01-02 MED ORDER — DENOSUMAB 60 MG/ML ~~LOC~~ SOLN
60.0000 mg | Freq: Once | SUBCUTANEOUS | Status: AC
  Administered 2015-01-02: 60 mg via SUBCUTANEOUS

## 2015-05-07 ENCOUNTER — Telehealth: Payer: Self-pay | Admitting: Internal Medicine

## 2015-05-07 NOTE — Telephone Encounter (Signed)
I contacted the pt and advised her last injection was was 01/02/2015. Pt was advised we would begin the process to get the injection approved. Pt advised to call back if she has any questions.

## 2015-05-07 NOTE — Telephone Encounter (Signed)
Patient is calling about the prolia shot, please advise

## 2015-05-09 ENCOUNTER — Ambulatory Visit: Payer: Medicare PPO | Admitting: Endocrinology

## 2015-05-30 ENCOUNTER — Telehealth: Payer: Self-pay | Admitting: Endocrinology

## 2015-05-30 NOTE — Telephone Encounter (Signed)
I contacted the pt and advised her next prolia injection is not until 07/03/2015. Requested a call back from the pt to verify if she would like to cancel the appointment.

## 2015-05-30 NOTE — Telephone Encounter (Signed)
I called pt to confirm appt and she wanted to know if her Prolia shot was here and if it was not she said she didn't feel there was any need coming tomorrow, she requests call back

## 2015-05-31 ENCOUNTER — Ambulatory Visit: Payer: Medicare PPO | Admitting: Endocrinology

## 2015-05-31 ENCOUNTER — Telehealth: Payer: Self-pay

## 2015-05-31 NOTE — Telephone Encounter (Signed)
Rose, Could we start the prolia PA for this pt. Her next injection is Due May 30th 2017. Thanks!

## 2015-06-06 NOTE — Telephone Encounter (Signed)
This [facility]'s [facility] is rejecting.  Does she have a different insurance that hasn't been put in the chart?  Could someone please check w/her to see if she has a different insurance now?  Thank you!

## 2015-06-06 NOTE — Telephone Encounter (Signed)
Requested a call back from the pt to discuss.  

## 2015-06-07 ENCOUNTER — Telehealth: Payer: Self-pay | Admitting: Endocrinology

## 2015-06-07 NOTE — Telephone Encounter (Signed)
Patient called new insurance has been put in system, it is ok to call for the Prolia inj. Provider # [phone]  Customer service # [phone]

## 2015-06-07 NOTE — Telephone Encounter (Signed)
Colin Muldersose Brewer advised of note below.

## 2015-06-07 NOTE — Telephone Encounter (Signed)
Alisha Becker, The pt called back and her new insurance has been up dated. She left this number:  Provider # [phone] Customer service # [phone]

## 2015-06-11 ENCOUNTER — Telehealth: Payer: Self-pay | Admitting: *Deleted

## 2015-06-11 NOTE — Telephone Encounter (Signed)
Rose, can you initiate a PA for a Prolia inj for pt, please? Thank you!  

## 2015-06-11 NOTE — Telephone Encounter (Signed)
I have electronically submitted pt's info for Prolia insurance verification and will notify you once I have a response. Thank you. °

## 2015-06-12 NOTE — Telephone Encounter (Signed)
I have electronically submitted pt's info for Prolia insurance verification and will notify you once I have a response. Thank you. °

## 2015-06-20 NOTE — Telephone Encounter (Signed)
I have rec'd insurance verification for Prolia and she has an estimated responsibility of $85.  Please make pt aware this is an estimate and we will not know an exact amt until insurance(s) has/have paid.  I have sent a copy of the summary of benefits to be scanned into pt's chart.    Once pt recs injection, please let me know actual injection date so I can update the Prolia portal.  If you have any questions, please let me know.  Thank you!

## 2015-06-22 NOTE — Telephone Encounter (Signed)
Pt is scheduled for 07/12/15 for the inj

## 2015-07-12 ENCOUNTER — Ambulatory Visit (INDEPENDENT_AMBULATORY_CARE_PROVIDER_SITE_OTHER): Payer: Medicare Other | Admitting: Endocrinology

## 2015-07-12 ENCOUNTER — Encounter: Payer: Self-pay | Admitting: Endocrinology

## 2015-07-12 VITALS — BP 144/74 | HR 83 | Temp 98.5°F | Resp 16 | Ht 61.0 in | Wt 111.5 lb

## 2015-07-12 DIAGNOSIS — M810 Age-related osteoporosis without current pathological fracture: Secondary | ICD-10-CM

## 2015-07-12 MED ORDER — DENOSUMAB 60 MG/ML ~~LOC~~ SOLN
60.0000 mg | Freq: Once | SUBCUTANEOUS | Status: AC
  Administered 2015-07-12: 60 mg via SUBCUTANEOUS

## 2015-07-12 NOTE — Patient Instructions (Addendum)
[facility] test [facility].  We'll also request blood test results.  We'll call you when we get the results.   Please get the Prolia shot today.   If you are due, please get the bone density test again. Based on the results, you should consider re-trying the Boniva or a similar medication (in addition to the Prolia).   Please come back for a follow-up appointment in 6 months

## 2015-07-12 NOTE — Progress Notes (Signed)
Subjective:    Patient ID: Alisha Becker, female    DOB: 02/06/1950, 65 y.o.   MRN: [id number]  HPI Pt returns for f/u of osteoporosis (dx'ed 2007; she took boniva x from 2008-2010, but she stopped due to lack of effect; she started prolia in 2011, and is still on it; she had fx toe as a child, and left humerus in 1980 (both with injuries)).  pt states she feels well in general. No recent bony fx. She has tolerated prolia well.   Past Medical History  Diagnosis Date  . Hypertension   . [id number](478.2)     Past Surgical History  Procedure Laterality Date  . Vaginal delivery      x2  . Wrist fracture surgery    . Laparoscopic gastrotomy w/ repair of ulcer    . Abdominoplasty    . Bladder suspension  08/12/2011    Procedure: TRANSVAGINAL TAPE (TVT) PROCEDURE;  Surgeon: Levi Aland, MD;  Location: [facility];  Service: Gynecology;  Laterality: N/A;  . Cystoscopy  08/12/2011    Procedure: CYSTOSCOPY;  Surgeon: Levi Aland, MD;  Location: [facility];  Service: Gynecology;  Laterality: N/A;    Social History   Social History  . Marital Status: Divorced    Spouse Name: N/A  . Number of Children: N/A  . Years of Education: N/A   Occupational History  . Not on file.   Social History Main Topics  . Smoking status: Never Smoker   . Smokeless tobacco: Not on file  . Alcohol Use: Yes     Comment: occasionally  . Drug Use: No  . Sexual Activity: Not on file   Other Topics Concern  . Not on file   Social History Narrative    Current Outpatient Prescriptions on File Prior to Visit  Medication Sig Dispense Refill  . amitriptyline (ELAVIL) 50 MG tablet Take 50 mg by mouth at bedtime.     Marland Kitchen amLODipine (NORVASC) 5 MG tablet Take by mouth daily.   3  . butalbital-acetaminophen-caffeine (FIORICET, ESGIC) 50-325-40 MG per tablet     . cloNIDine (CATAPRES) 0.1 MG tablet Take 0.1 mg by mouth at bedtime.      Marland Kitchen estradiol (ESTRACE) 1 MG tablet Take 1 mg by mouth daily.      Marland Kitchen KRILL OIL  PO Take by mouth.    . Multiple Vitamin (MULTIVITAMIN WITH MINERALS) TABS Take 0.5 tablets by mouth 2 (two) times daily.    Marland Kitchen PATADAY 0.2 % SOLN Place 1 drop into both eyes daily.     . rizatriptan (MAXALT) 5 MG tablet      Current Facility-Administered Medications on File Prior to Visit  Medication Dose Route Frequency Provider Last Rate Last Dose  . denosumab (PROLIA) injection 60 mg  60 mg Subcutaneous Once Romero Belling, MD        Allergies  Allergen Reactions  . Aspirin Other (See Comments)    Pt has history of ulcers  . Codeine Nausea Only  . Latex Rash    No family history on file.  BP 144/74 mmHg  Pulse 83  Temp(Src) 98.5 F (36.9 C) (Oral)  Resp 16  Ht  (1.549 m)  Wt 111 lb 8 oz (50.576 kg)  BMI 21.08 kg/m2  Review of Systems Denies heartburn    Objective:   Physical Exam VITAL SIGNS:  See vs page GENERAL: no distress Chest wall: no kyphosis  Lab Results  Component Value Date   PTH  21.5 05/23/2013   CALCIUM 9.6 05/23/2013      Assessment & Plan:  Osteoporosis, due to rx and recheck.   Patient is advised the following: Patient Instructions  [facility] density test [facility].  We'll also request blood test results.  We'll call you when we get the results.   Please get the Prolia shot today.   If you are due, please get the bone density test again. Based on the results, you should consider re-trying the Boniva or a similar medication (in addition to the Prolia).   Please come back for a follow-up appointment in 6 months   Romero BellingELLISON, Alverda Nazzaro, MD

## 2015-07-12 NOTE — Progress Notes (Signed)
Pre visit review using our clinic review tool, if applicable. No additional management support is needed unless otherwise documented below in the visit note. 

## 2015-07-17 ENCOUNTER — Telehealth: Payer: Self-pay | Admitting: Endocrinology

## 2015-07-17 NOTE — Telephone Encounter (Signed)
[facility] Rosalita Levan(), when pt's last bone density test was. also blood test results. thank you

## 2015-07-17 NOTE — Telephone Encounter (Signed)
See below. Is this order in epic? Thanks!

## 2015-07-17 NOTE — Telephone Encounter (Addendum)
Requested a call back from medical records to discuss.

## 2015-07-17 NOTE — Telephone Encounter (Signed)
PT called and said she was told to contact us if she hasn't heard back from us in a week about if she needs to have a bone scan rescheduled or not.

## 2015-07-18 ENCOUNTER — Telehealth: Payer: Self-pay | Admitting: Endocrinology

## 2015-07-18 DIAGNOSIS — M810 Age-related osteoporosis without current pathological fracture: Secondary | ICD-10-CM

## 2015-07-18 NOTE — Telephone Encounter (Signed)
I contacted the pt and advised of note below. Requested a call back from the pt to verify if she would like to have the bone density test done in [place] or [place].

## 2015-07-18 NOTE — Telephone Encounter (Signed)
[facility] Results placed on your desk to review.

## 2015-07-18 NOTE — Telephone Encounter (Signed)
please call patient: We received results from [place], and you are due to have the bone density rechecked. Please have it rechecked, and ask that a copy be sent here.

## 2015-07-19 NOTE — Telephone Encounter (Signed)
Pt wants bone density in an [place] location

## 2015-07-19 NOTE — Telephone Encounter (Signed)
done

## 2015-07-19 NOTE — Telephone Encounter (Signed)
Order placed on your desk to sign. Thanks!

## 2015-07-19 NOTE — Telephone Encounter (Signed)
See note below. Can you place the order? Thanks!

## 2015-07-20 NOTE — Telephone Encounter (Signed)
[facility]

## 2015-08-09 ENCOUNTER — Telehealth: Payer: Self-pay | Admitting: Endocrinology

## 2015-08-09 NOTE — Telephone Encounter (Signed)
[facility] dr redding,needs us to send an order for the pt to have a bone scan done there please

## 2015-08-09 NOTE — Telephone Encounter (Signed)
Order printed and faxed to Dr. Alcario Droughteddings office.

## 2015-10-10 ENCOUNTER — Telehealth: Payer: Self-pay | Admitting: Endocrinology

## 2015-10-10 NOTE — Telephone Encounter (Signed)
I contacted the patient and advised of message. Patient voiced understanding.  

## 2015-10-10 NOTE — Telephone Encounter (Signed)
please call patient: We received the bone density result.   It is a little better than the last one. Please continue the same prolia. We don't need to add another med now I'll see you next time.

## 2016-01-11 ENCOUNTER — Telehealth: Payer: Self-pay | Admitting: Endocrinology

## 2016-01-11 NOTE — Telephone Encounter (Signed)
Pt's benefits have been checked prolia copay is $105   appt is 01/15/16 at 1:45

## 2016-01-11 NOTE — Telephone Encounter (Signed)
Noted  

## 2016-01-15 ENCOUNTER — Ambulatory Visit: Payer: Medicare Other

## 2016-01-31 ENCOUNTER — Ambulatory Visit (INDEPENDENT_AMBULATORY_CARE_PROVIDER_SITE_OTHER): Payer: Medicare Other

## 2016-01-31 DIAGNOSIS — M810 Age-related osteoporosis without current pathological fracture: Secondary | ICD-10-CM | POA: Diagnosis not present

## 2016-01-31 MED ORDER — DENOSUMAB 60 MG/ML ~~LOC~~ SOLN
60.0000 mg | Freq: Once | SUBCUTANEOUS | Status: AC
  Administered 2016-01-31: 60 mg via SUBCUTANEOUS

## 2016-05-29 ENCOUNTER — Telehealth: Payer: Self-pay | Admitting: *Deleted

## 2016-05-29 NOTE — Telephone Encounter (Signed)
Information has been submitted to pts insurance for verification of benefits. Awaiting response for coverage  

## 2016-07-18 NOTE — Telephone Encounter (Signed)
Verification of benefits have been processed and an approval has been received for pts prolia injection. Pts estimated cost are appx $90. This is only an estimate and cannot be confirmed until benefits are paid. Please advise pt and schedule if needed. If scheduled, once the injection is received, pls contact me back with the date it was received so that I am able to update prolia folder. thanks  Injection can be scheduled after 08/01/16  Lm on pts vm requesting a call back

## 2016-07-22 NOTE — Telephone Encounter (Signed)
Patient scheduled for Prolia on 07/29/2016 at 1:30pm.  Advised on estimated cost.  Thank you,  -LL

## 2016-07-29 ENCOUNTER — Ambulatory Visit (INDEPENDENT_AMBULATORY_CARE_PROVIDER_SITE_OTHER): Payer: Medicare Other | Admitting: Endocrinology

## 2016-07-29 DIAGNOSIS — M810 Age-related osteoporosis without current pathological fracture: Secondary | ICD-10-CM

## 2016-07-29 MED ORDER — DENOSUMAB 60 MG/ML ~~LOC~~ SOLN
60.0000 mg | Freq: Once | SUBCUTANEOUS | Status: AC
  Administered 2016-07-29: 60 mg via SUBCUTANEOUS

## 2016-07-29 MED ORDER — DENOSUMAB 60 MG/ML ~~LOC~~ SOLN: 60.0000 mg | Freq: Once | SUBCUTANEOUS | 0 refills | Status: DC

## 2016-07-30 NOTE — Telephone Encounter (Signed)
Injection was given 07/29/16

## 2017-01-29 ENCOUNTER — Ambulatory Visit (INDEPENDENT_AMBULATORY_CARE_PROVIDER_SITE_OTHER): Payer: Medicare Other

## 2017-01-29 DIAGNOSIS — M810 Age-related osteoporosis without current pathological fracture: Secondary | ICD-10-CM | POA: Diagnosis not present

## 2017-01-29 MED ORDER — DENOSUMAB 60 MG/ML ~~LOC~~ SOLN
60.0000 mg | Freq: Once | SUBCUTANEOUS | Status: AC
  Administered 2017-01-29: 60 mg via SUBCUTANEOUS

## 2017-07-20 ENCOUNTER — Telehealth: Payer: Self-pay | Admitting: Endocrinology

## 2017-07-20 NOTE — Telephone Encounter (Signed)
Patient stated she got a call to set up a prolia injection. Please advise  Don't want to schedule appt. unless I know for sure patient, was cleared to be schedule. I didn't see a note in her chart,

## 2017-07-21 ENCOUNTER — Telehealth: Payer: Self-pay | Admitting: Endocrinology

## 2017-07-21 NOTE — Telephone Encounter (Signed)
Take 1000 mg per day.  F/u ov is due.

## 2017-07-21 NOTE — Telephone Encounter (Signed)
I have called & advised patient. Also I scheduled patient f/u for 8/14.

## 2017-07-21 NOTE — Telephone Encounter (Signed)
Patient stated they kidney stone has to be surgically removed and needs to adjust the amount of calcium she is taking and would like to know what Dr Everardo AllEllison recommends   Please advise.

## 2017-07-21 NOTE — Telephone Encounter (Signed)
LMTCB to schedule prolia- she owes $90 and is clear to get injection on 07/31/17

## 2017-07-22 NOTE — Telephone Encounter (Signed)
Patient is scheduled on 08/04/17 for Prolia

## 2017-08-04 ENCOUNTER — Ambulatory Visit: Payer: Medicare Other

## 2017-08-04 DIAGNOSIS — M810 Age-related osteoporosis without current pathological fracture: Secondary | ICD-10-CM | POA: Diagnosis not present

## 2017-08-04 MED ORDER — DENOSUMAB 60 MG/ML ~~LOC~~ SOSY
60.0000 mg | PREFILLED_SYRINGE | Freq: Once | SUBCUTANEOUS | Status: AC
  Administered 2017-08-04: 60 mg via SUBCUTANEOUS

## 2017-08-04 NOTE — Telephone Encounter (Signed)
Got prolia today

## 2017-08-04 NOTE — Progress Notes (Signed)
Per orders of Dr. Romero BellingSean Ellison injection of prolia given today by Lelon Frohlichashia Golden RMA . Patient tolerated injection well.

## 2017-09-16 ENCOUNTER — Encounter: Payer: Self-pay | Admitting: Endocrinology

## 2017-09-16 ENCOUNTER — Ambulatory Visit: Payer: Medicare Other | Admitting: Endocrinology

## 2017-09-16 VITALS — BP 152/88 | HR 93 | Ht 61.0 in | Wt 118.8 lb

## 2017-09-16 DIAGNOSIS — M810 Age-related osteoporosis without current pathological fracture: Secondary | ICD-10-CM | POA: Diagnosis not present

## 2017-09-16 NOTE — Progress Notes (Signed)
Subjective:    Patient ID: Alisha Becker, female    DOB: 08/18/1950, 67 y.o.   MRN: [id number]  HPI Pt returns for f/u of osteoporosis:  Dx'ed: 2007 Secondary cause: none found Fractures:toe as a child, and left humerus in 1980 (both with injuries) Past rx: boniva x from 2008-2010, but she stopped due to lack of effect Current rx: prolia since 2011 Last DEXA result (2017): worst T-score was -1.8 (RFN) Other: she had urolithiasis in mid-2019 Interval hx:  She has tolerated prolia well.  She stopped Ca++ with kidney stone episode.  pt states she feels well in general.  No recent bony fracture.   Past Medical History:  Diagnosis Date  . Headache(784.0)   . Hypertension     Past Surgical History:  Procedure Laterality Date  . ABDOMINOPLASTY    . BLADDER SUSPENSION  08/12/2011   Procedure: TRANSVAGINAL TAPE (TVT) PROCEDURE;  Surgeon: Levi AlandMark E Anderson, MD;  Location: [facility];  Service: Gynecology;  Laterality: N/A;  . CYSTOSCOPY  08/12/2011   Procedure: CYSTOSCOPY;  Surgeon: Levi AlandMark E Anderson, MD;  Location: [facility];  Service: Gynecology;  Laterality: N/A;  . LAPAROSCOPIC GASTROTOMY W/ REPAIR OF ULCER    . VAGINAL DELIVERY     x2  . WRIST FRACTURE SURGERY      Social History   Socioeconomic History  . Marital status: Divorced    Spouse name: Not on file  . Number of children: Not on file  . Years of education: Not on file  . Highest education level: Not on file  Occupational History  . Not on file  Social Needs  . Financial resource strain: Not on file  . Food insecurity:    Worry: Not on file    Inability: Not on file  . Transportation needs:    Medical: Not on file    Non-medical: Not on file  Tobacco Use  . Smoking status: Never Smoker  . Smokeless tobacco: Never Used  Substance and Sexual Activity  . Alcohol use: Yes    Comment: occasionally  . Drug use: No  . Sexual activity: Not on file  Lifestyle  . Physical activity:    Days per week: Not on file    Minutes  per session: Not on file  . Stress: Not on file  Relationships  . Social connections:    Talks on phone: Not on file    Gets together: Not on file    Attends religious service: Not on file    Active member of club or organization: Not on file    Attends meetings of clubs or organizations: Not on file    Relationship status: Not on file  . Intimate partner violence:    Fear of current or ex partner: Not on file    Emotionally abused: Not on file    Physically abused: Not on file    Forced sexual activity: Not on file  Other Topics Concern  . Not on file  Social History Narrative  . Not on file    Current Outpatient Medications on File Prior to Visit  Medication Sig Dispense Refill  . amitriptyline (ELAVIL) 50 MG tablet Take 50 mg by mouth at bedtime.     Marland Kitchen. amLODipine (NORVASC) 5 MG tablet Take by mouth daily.   3  . butalbital-acetaminophen-caffeine (FIORICET, ESGIC) 50-325-40 MG per tablet     . cloNIDine (CATAPRES) 0.1 MG tablet Take 0.1 mg by mouth at bedtime.      .Marland Kitchen  estradiol (ESTRACE) 1 MG tablet Take 1 mg by mouth daily.      . Multiple Vitamin (MULTIVITAMIN WITH MINERALS) TABS Take 0.5 tablets by mouth 2 (two) times daily.    Marland Kitchen. PATADAY 0.2 % SOLN Place 1 drop into both eyes daily.     . rizatriptan (MAXALT) 5 MG tablet     . KRILL OIL PO Take by mouth.     Current Facility-Administered Medications on File Prior to Visit  Medication Dose Route Frequency Provider Last Rate Last Dose  . denosumab (PROLIA) injection 60 mg  60 mg Subcutaneous Once Romero BellingEllison, Naja Apperson, MD        Allergies  Allergen Reactions  . Aspirin Other (See Comments)    Pt has history of ulcers  . Codeine Nausea Only  . Latex Rash    No family history on file.  BP (!) 152/88 (BP Location: Right Arm, Patient Position: Sitting, Cuff Size: Normal)   Pulse 93   Ht 5\' 1"  (1.549 m)   Wt 118 lb 12.8 oz (53.9 kg)   SpO2 95%   BMI 22.45 kg/m    Review of Systems She denies falls.      Objective:    Physical Exam VITAL SIGNS:  See vs page GENERAL: no distress Spine: no kyphosis Gait: normal and steady.       Assessment & Plan:  Osteoporosis: due for recheck Vit-D def: recheck today  Patient Instructions  blood tests are requested for you today.  We'll let you know about the results.  [facility]they'll send the results here.  Depending on the blood test results, if the bone density is worse, you can add a daily injection called "Forteo."   Or, we can resume the Boniva.   Please come back for a follow-up appointment in 1 year.

## 2017-09-16 NOTE — Patient Instructions (Addendum)
blood tests are requested for you today.  We'll let you know about the results.  [facility]'ll [facility] the results here.  Depending on the blood test results, if the bone density is worse, you can add a daily injection called "Forteo."   Or, we can resume the Boniva.   Please come back for a follow-up appointment in 1 year.

## 2017-09-17 LAB — PTH, INTACT AND CALCIUM
CALCIUM: 10 mg/dL (ref 8.6–10.4)
PTH: 15 pg/mL (ref 14–64)

## 2017-09-17 LAB — VITAMIN D 25 HYDROXY (VIT D DEFICIENCY, FRACTURES): VITD: 32.95 ng/mL (ref 30.00–100.00)

## 2017-09-24 ENCOUNTER — Encounter: Payer: Self-pay | Admitting: Endocrinology

## 2017-09-25 ENCOUNTER — Telehealth: Payer: Self-pay | Admitting: Endocrinology

## 2017-09-25 NOTE — Telephone Encounter (Signed)
Patient received call from our office with no message. Patient is returning call. Please call patient at ph# [phone].

## 2017-09-25 NOTE — Telephone Encounter (Signed)
Patient was already spoken to regarding labs & no other call is documented.

## 2018-02-11 ENCOUNTER — Telehealth: Payer: Self-pay

## 2018-02-11 NOTE — Telephone Encounter (Signed)
Received fax from [place] with Summary of Benefits pertaining to Prolia. Coverage details as follows: - Provider is in network for this patient's plan - Prolia is subject to a $50.00 copay - Whether office visit is billed or not, pt is responsible for a $40.00 copay which will cover admin and the OV - Copay contribute to a $4000.00 out of pocket max ($0 met) - No deductible or co-insurance applies - PA not required Above info routed to Linus Galas, [place]. Documents have been labeled and placed in scan file for HIM scanning purposes and for our future reference.

## 2018-03-30 ENCOUNTER — Other Ambulatory Visit: Payer: Self-pay

## 2018-03-30 ENCOUNTER — Ambulatory Visit: Payer: Medicare Other

## 2018-03-30 DIAGNOSIS — M810 Age-related osteoporosis without current pathological fracture: Secondary | ICD-10-CM

## 2018-03-30 MED ORDER — DENOSUMAB 60 MG/ML ~~LOC~~ SOSY: 60.0000 mg | PREFILLED_SYRINGE | Freq: Once | SUBCUTANEOUS | Status: DC

## 2018-03-30 MED ORDER — DENOSUMAB 60 MG/ML ~~LOC~~ SOSY
60.0000 mg | PREFILLED_SYRINGE | Freq: Once | SUBCUTANEOUS | Status: AC
  Administered 2018-03-30: 60 mg via SUBCUTANEOUS

## 2018-03-30 NOTE — Progress Notes (Signed)
Per orders of Dr. Everardo All injection of Prolia given today by N.Mincy,LPN. Prolia 60mg  given in right arm. Patient tolerated injection well.

## 2018-04-16 ENCOUNTER — Encounter: Payer: Self-pay | Admitting: Endocrinology

## 2018-04-21 ENCOUNTER — Telehealth: Payer: Self-pay | Admitting: Endocrinology

## 2018-04-21 NOTE — Telephone Encounter (Signed)
Called pt and made her aware. Verbalized acceptance and understanding. 

## 2018-04-21 NOTE — Telephone Encounter (Signed)
please contact patient: Bone density is the same.  Please continue the same Prolia. I'll see you next time.

## 2018-09-16 ENCOUNTER — Other Ambulatory Visit: Payer: Self-pay

## 2018-09-20 ENCOUNTER — Ambulatory Visit: Payer: Medicare Other | Admitting: Endocrinology

## 2018-09-20 ENCOUNTER — Encounter: Payer: Self-pay | Admitting: Endocrinology

## 2018-09-20 ENCOUNTER — Telehealth: Payer: Self-pay

## 2018-09-20 ENCOUNTER — Other Ambulatory Visit: Payer: Self-pay

## 2018-09-20 VITALS — BP 110/70 | HR 67 | Ht 61.0 in | Wt 109.4 lb

## 2018-09-20 DIAGNOSIS — M810 Age-related osteoporosis without current pathological fracture: Secondary | ICD-10-CM

## 2018-09-20 LAB — BASIC METABOLIC PANEL
BUN: 14 mg/dL (ref 6–23)
CO2: 28 mEq/L (ref 19–32)
Calcium: 8.7 mg/dL (ref 8.4–10.5)
Chloride: 105 mEq/L (ref 96–112)
Creatinine, Ser: 0.71 mg/dL (ref 0.40–1.20)
GFR: 81.37 mL/min (ref >60.00)
Glucose, Bld: 84 mg/dL (ref 70–99)
Potassium: 4 mEq/L (ref 3.5–5.1)
Sodium: 141 mEq/L (ref 135–145)

## 2018-09-20 LAB — VITAMIN D 25 HYDROXY (VIT D DEFICIENCY, FRACTURES): VITD: 47.5 ng/mL (ref 30.00–100.00)

## 2018-09-20 NOTE — Progress Notes (Signed)
Subjective:    Patient ID: Alisha Becker, female    DOB: [birth year], 68 y.o.   MRN: [id number]  HPI Pt returns for f/u of osteoporosis:  Dx'ed: 2007 Secondary cause: none found Fractures: toe as a child, and left humerus in 1980 (both with injuries).   Past rx: boniva x from 2008-2010, but she stopped due to lack of effect.  Current rx: prolia since 2011 Last DEXA result (2020): worst T-score was -1.8 (RFN) Other: she had urolithiasis in mid-2019.   Interval hx:  She has tolerated prolia well.  She takes Ca++ (265 mg qd) and vit-D (333 units/day).  pt states she feels well in general.  No recent bony fracture.   Past Medical History:  Diagnosis Date  . Headache(784.0)   . Hypertension     Past Surgical History:  Procedure Laterality Date  . ABDOMINOPLASTY    . BLADDER SUSPENSION  08/12/2011   Procedure: TRANSVAGINAL TAPE (TVT) PROCEDURE;  Surgeon: Levi AlandMark E Anderson, MD;  Location: [facility];  Service: Gynecology;  Laterality: N/A;  . CYSTOSCOPY  08/12/2011   Procedure: CYSTOSCOPY;  Surgeon: Levi AlandMark E Anderson, MD;  Location: [facility];  Service: Gynecology;  Laterality: N/A;  . LAPAROSCOPIC GASTROTOMY W/ REPAIR OF ULCER    . VAGINAL DELIVERY     x2  . WRIST FRACTURE SURGERY      Social History   Socioeconomic History  . Marital status: Divorced    Spouse name: Not on file  . Number of children: Not on file  . Years of education: Not on file  . Highest education level: Not on file  Occupational History  . Not on file  Social Needs  . Financial resource strain: Not on file  . Food insecurity    Worry: Not on file    Inability: Not on file  . Transportation needs    Medical: Not on file    Non-medical: Not on file  Tobacco Use  . Smoking status: Never Smoker  . Smokeless tobacco: Never Used  Substance and Sexual Activity  . Alcohol use: Yes    Comment: occasionally  . Drug use: No  . Sexual activity: Not on file  Lifestyle  . Physical activity    Days per week: Not on  file    Minutes per session: Not on file  . Stress: Not on file  Relationships  . Social Musicianconnections    Talks on phone: Not on file    Gets together: Not on file    Attends religious service: Not on file    Active member of club or organization: Not on file    Attends meetings of clubs or organizations: Not on file    Relationship status: Not on file  . Intimate partner violence    Fear of current or ex partner: Not on file    Emotionally abused: Not on file    Physically abused: Not on file    Forced sexual activity: Not on file  Other Topics Concern  . Not on file  Social History Narrative  . Not on file    Current Outpatient Medications on File Prior to Visit  Medication Sig Dispense Refill  . amLODipine (NORVASC) 5 MG tablet Take 10 mg by mouth daily.   3  . Biotin 1 MG CAPS Take 1 tablet by mouth daily.    . butalbital-acetaminophen-caffeine (FIORICET, ESGIC) 50-325-40 MG per tablet Take 1 tablet by mouth every 4 (four) hours as needed.     .Marland Kitchen  Calcium Carb-Cholecalciferol (CALCIUM 1000 + D PO) Take 1 tablet by mouth daily.    . citalopram (CELEXA) 20 MG tablet Take 20 mg by mouth daily.    . DHA-EPA-Vitamin E (OMEGA-3 COMPLEX PO) Take 1 tablet by mouth daily.    Marland Kitchen lisinopril (ZESTRIL) 20 MG tablet Take 20 mg by mouth daily.    . Multiple Vitamin (MULTIVITAMIN WITH MINERALS) TABS Take 0.5 tablets by mouth 2 (two) times daily.    . Multiple Vitamins-Minerals (PRESERVISION AREDS 2 PO) Take 1 tablet by mouth daily.    Marland Kitchen PATADAY 0.2 % SOLN Place 1 drop into both eyes daily.     . Probiotic Product (PROBIOTIC-10 ULTIMATE) CAPS Take 1 capsule by mouth daily.    . rizatriptan (MAXALT) 5 MG tablet Take 5 mg by mouth as needed.     . traZODone (DESYREL) 50 MG tablet Take 50 mg by mouth at bedtime.     Current Facility-Administered Medications on File Prior to Visit  Medication Dose Route Frequency Provider Last Rate Last Dose  . denosumab (PROLIA) injection 60 mg  60 mg Subcutaneous  Once Renato Shin, MD        Allergies  Allergen Reactions  . Aspirin Other (See Comments)    Pt has history of ulcers  . Codeine Nausea Only  . Latex Rash    No family history on file.  BP 110/70 (BP Location: Left Arm, Patient Position: Sitting, Cuff Size: Normal)   Pulse 67   Ht 5\' 1"  (1.549 m)   Wt 109 lb 6.4 oz (49.6 kg)   SpO2 97%   BMI 20.67 kg/m    Review of Systems Denies muscle cramps.     Objective:   Physical Exam VITAL SIGNS:  See vs page GENERAL: no distress SPINE: no kyphosis.        Assessment & Plan:  Osteoporosis: good response to Prolia.  Vit-D def: due for recheck.    Patient Instructions  blood tests are requested for you today.  We'll let you know about the results.  Please continue the same Prolia.   Please come back for a follow-up appointment in 1 year.

## 2018-09-20 NOTE — Telephone Encounter (Signed)
Per Dr. Loanne Drilling, pt is overdue for Prolia injection. Pt expressed concern and frustration that this has "fallen through the cracks". Advised I forward her concerns to our office manager. Verbalized acceptance and understanding.

## 2018-09-20 NOTE — Patient Instructions (Addendum)
blood tests are requested for you today.  We'll let you know about the results.  Please continue the same Prolia.   Please come back for a follow-up appointment in 1 year.

## 2018-09-21 LAB — PTH, INTACT AND CALCIUM
Calcium: 8.8 mg/dL (ref 8.6–10.4)
PTH: 39 pg/mL (ref 14–64)

## 2018-09-22 NOTE — Telephone Encounter (Signed)
Pt's last shot was 2.25.2020. she is due no sooner than 09/29/2018. No Josem Kaufmann is needed, copay is $90

## 2018-09-30 ENCOUNTER — Other Ambulatory Visit: Payer: Self-pay

## 2018-09-30 ENCOUNTER — Ambulatory Visit (INDEPENDENT_AMBULATORY_CARE_PROVIDER_SITE_OTHER): Payer: Medicare Other

## 2018-09-30 DIAGNOSIS — M810 Age-related osteoporosis without current pathological fracture: Secondary | ICD-10-CM

## 2018-09-30 MED ORDER — DENOSUMAB 60 MG/ML ~~LOC~~ SOSY
60.0000 mg | PREFILLED_SYRINGE | Freq: Once | SUBCUTANEOUS | Status: AC
  Administered 2018-09-30: 14:00:00 60 mg via SUBCUTANEOUS

## 2018-09-30 NOTE — Progress Notes (Signed)
Per orders of Dr. Ellison injection of Prolia given today by Preciliano Castell, Certified Medical Assistant . Patient tolerated injection well.  

## 2019-02-08 DIAGNOSIS — H35353 Cystoid macular degeneration, bilateral: Secondary | ICD-10-CM | POA: Diagnosis not present

## 2019-02-09 DIAGNOSIS — B372 Candidiasis of skin and nail: Secondary | ICD-10-CM | POA: Diagnosis not present

## 2019-03-18 DIAGNOSIS — H35353 Cystoid macular degeneration, bilateral: Secondary | ICD-10-CM | POA: Diagnosis not present

## 2019-04-08 ENCOUNTER — Telehealth: Payer: Self-pay

## 2019-04-08 NOTE — Telephone Encounter (Signed)
Submitted pt for insurance verification via [employer] Portal for Prolia injection 04/08/2019

## 2019-04-15 ENCOUNTER — Other Ambulatory Visit: Payer: Self-pay

## 2019-04-15 NOTE — Telephone Encounter (Signed)
PA initiated via [email or web address] for Prolia 60mg /mL injection.  Ruby Langwell Key - PA Case ID: Theodis Blaze Need help? Call [phone] at [phone] Status Sent to [facility] Prolia 60MG /ML syringes Form [employer] Electronic PA Form  Currently awaiting determination.

## 2019-04-15 NOTE — Telephone Encounter (Signed)
Alisha Becker (Key: [id number])  Your information has been submitted to [facility]. [employer] will review the request and will issue a decision, typically within 3-7 days from your submission. You can check the updated outcome later by reopening this request.  If [employer] has not responded in 3-7 days or if you have any questions about your ePA request, please contact [employer] at [phone]. If you think there may be a problem with your PA request, use our live chat feature at the bottom right.  For [place] requests, please call [phone].

## 2019-04-15 NOTE — Telephone Encounter (Signed)
Received notification from [employer] that PA is required for Prolia injection. Called insurance and initiated PA and received key code for [email or web address] in order to complete PA, as this is the fastest and preferred way of completing PA.  Key isTheodis Blaze  Ref #: [id number]   Will complete PA. And document accordingly.

## 2019-04-15 NOTE — Progress Notes (Signed)
prolia 

## 2019-04-22 NOTE — Telephone Encounter (Signed)
Pt is approved for Prolia, Due now appt is on 05/05/19

## 2019-05-05 ENCOUNTER — Other Ambulatory Visit: Payer: Self-pay

## 2019-05-05 ENCOUNTER — Ambulatory Visit (INDEPENDENT_AMBULATORY_CARE_PROVIDER_SITE_OTHER): Payer: Medicare PPO

## 2019-05-05 DIAGNOSIS — M810 Age-related osteoporosis without current pathological fracture: Secondary | ICD-10-CM | POA: Diagnosis not present

## 2019-05-05 MED ORDER — DENOSUMAB 60 MG/ML ~~LOC~~ SOSY
60.0000 mg | PREFILLED_SYRINGE | Freq: Once | SUBCUTANEOUS | Status: AC
  Administered 2019-05-05: 60 mg via SUBCUTANEOUS

## 2019-05-05 NOTE — Progress Notes (Addendum)
Per Dr Everardo All Prolia 60 mg given to patient in Right arm.  Patient tolerated injection well.    I supervised the injection. Alisha Pavlov, MD PhD [facility]

## 2019-05-24 DIAGNOSIS — Z79899 Other long term (current) drug therapy: Secondary | ICD-10-CM | POA: Diagnosis not present

## 2019-05-24 DIAGNOSIS — R5383 Other fatigue: Secondary | ICD-10-CM | POA: Diagnosis not present

## 2019-05-24 DIAGNOSIS — M810 Age-related osteoporosis without current pathological fracture: Secondary | ICD-10-CM | POA: Diagnosis not present

## 2019-05-24 DIAGNOSIS — G4700 Insomnia, unspecified: Secondary | ICD-10-CM | POA: Diagnosis not present

## 2019-05-24 DIAGNOSIS — F419 Anxiety disorder, unspecified: Secondary | ICD-10-CM | POA: Diagnosis not present

## 2019-05-24 DIAGNOSIS — M1990 Unspecified osteoarthritis, unspecified site: Secondary | ICD-10-CM | POA: Diagnosis not present

## 2019-05-24 DIAGNOSIS — I10 Essential (primary) hypertension: Secondary | ICD-10-CM | POA: Diagnosis not present

## 2019-05-24 DIAGNOSIS — Z6821 Body mass index (BMI) 21.0-21.9, adult: Secondary | ICD-10-CM | POA: Diagnosis not present

## 2019-05-24 DIAGNOSIS — E782 Mixed hyperlipidemia: Secondary | ICD-10-CM | POA: Diagnosis not present

## 2019-09-20 ENCOUNTER — Other Ambulatory Visit: Payer: Self-pay

## 2019-09-20 ENCOUNTER — Ambulatory Visit: Payer: Medicare PPO | Admitting: Endocrinology

## 2019-09-20 ENCOUNTER — Encounter: Payer: Self-pay | Admitting: Endocrinology

## 2019-09-20 VITALS — BP 128/78 | HR 87 | Ht 61.0 in | Wt 104.4 lb

## 2019-09-20 DIAGNOSIS — M810 Age-related osteoporosis without current pathological fracture: Secondary | ICD-10-CM | POA: Diagnosis not present

## 2019-09-20 LAB — BASIC METABOLIC PANEL
BUN: 17 mg/dL (ref 6–23)
CO2: 31 mEq/L (ref 19–32)
Calcium: 9.4 mg/dL (ref 8.4–10.5)
Chloride: 102 mEq/L (ref 96–112)
Creatinine, Ser: 0.76 mg/dL (ref 0.40–1.20)
GFR: 75.01 mL/min (ref >60.00)
Glucose, Bld: 93 mg/dL (ref 70–99)
Potassium: 4 mEq/L (ref 3.5–5.1)
Sodium: 141 mEq/L (ref 135–145)

## 2019-09-20 LAB — VITAMIN D 25 HYDROXY (VIT D DEFICIENCY, FRACTURES): VITD: 47.27 ng/mL (ref 30.00–100.00)

## 2019-09-20 NOTE — Progress Notes (Signed)
Subjective:    Patient ID: Alisha Becker, female    DOB: 09-Jul-1950, 69 y.o.   MRN: [id number]  HPI Pt returns for f/u of osteoporosis:  Dx'ed: 2007 Secondary cause: none found Fractures: toe as a child, and left humerus in 1980 (both with injuries).   Past rx: boniva from 2008-2010, but she stopped due to lack of effect.  Current rx: prolia since 2011.   Last DEXA result (2020): worst T-score was -1.8 (RFN).   Other: she had urolithiasis in mid-2019.   Interval hx:  She has tolerated prolia well.  She takes Ca++ and vit-D (uncertain dosages).  pt states she feels well in general.  No recent bony fracture.     Past Medical History:  Diagnosis Date  . Headache(784.0)   . Hypertension     Past Surgical History:  Procedure Laterality Date  . ABDOMINOPLASTY    . BLADDER SUSPENSION  08/12/2011   Procedure: TRANSVAGINAL TAPE (TVT) PROCEDURE;  Surgeon: Levi Aland, MD;  Location: [facility];  Service: Gynecology;  Laterality: N/A;  . CYSTOSCOPY  08/12/2011   Procedure: CYSTOSCOPY;  Surgeon: Levi Aland, MD;  Location: [facility];  Service: Gynecology;  Laterality: N/A;  . LAPAROSCOPIC GASTROTOMY W/ REPAIR OF ULCER    . VAGINAL DELIVERY     x2  . WRIST FRACTURE SURGERY      Social History   Socioeconomic History  . Marital status: Divorced    Spouse name: Not on file  . Number of children: Not on file  . Years of education: Not on file  . Highest education level: Not on file  Occupational History  . Not on file  Tobacco Use  . Smoking status: Never Smoker  . Smokeless tobacco: Never Used  Substance and Sexual Activity  . Alcohol use: Yes    Comment: occasionally  . Drug use: No  . Sexual activity: Not on file  Other Topics Concern  . Not on file  Social History Narrative  . Not on file   Social Determinants of Health   Financial Resource Strain:   . Difficulty of Paying Living Expenses: Not on file  Food Insecurity:   . Worried About Programme researcher, broadcasting/film/video in the  Last Year: Not on file  . Ran Out of Food in the Last Year: Not on file  Transportation Needs:   . Lack of Transportation (Medical): Not on file  . Lack of Transportation (Non-Medical): Not on file  Physical Activity:   . Days of Exercise per Week: Not on file  . Minutes of Exercise per Session: Not on file  Stress:   . Feeling of Stress : Not on file  Social Connections:   . Frequency of Communication with Friends and Family: Not on file  . Frequency of Social Gatherings with Friends and Family: Not on file  . Attends Religious Services: Not on file  . Active Member of Clubs or Organizations: Not on file  . Attends Banker Meetings: Not on file  . Marital Status: Not on file  Intimate Partner Violence:   . Fear of Current or Ex-Partner: Not on file  . Emotionally Abused: Not on file  . Physically Abused: Not on file  . Sexually Abused: Not on file    Current Outpatient Medications on File Prior to Visit  Medication Sig Dispense Refill  . amLODipine (NORVASC) 5 MG tablet Take 10 mg by mouth daily.   3  . Biotin 1 MG CAPS  Take 1 tablet by mouth daily.    . butalbital-acetaminophen-caffeine (FIORICET, ESGIC) 50-325-40 MG per tablet Take 1 tablet by mouth every 4 (four) hours as needed.     . Calcium Carb-Cholecalciferol (CALCIUM 1000 + D PO) Take 1 tablet by mouth daily.    . citalopram (CELEXA) 20 MG tablet Take 20 mg by mouth daily.    . DHA-EPA-Vitamin E (OMEGA-3 COMPLEX PO) Take 1 tablet by mouth daily.    Marland Kitchen lisinopril (ZESTRIL) 20 MG tablet Take 20 mg by mouth daily.    . Multiple Vitamin (MULTIVITAMIN WITH MINERALS) TABS Take 0.5 tablets by mouth 2 (two) times daily.    . Multiple Vitamins-Minerals (PRESERVISION AREDS 2 PO) Take 1 tablet by mouth daily.    Marland Kitchen PATADAY 0.2 % SOLN Place 1 drop into both eyes daily.     . Probiotic Product (PROBIOTIC-10 ULTIMATE) CAPS Take 1 capsule by mouth daily.    . rizatriptan (MAXALT) 5 MG tablet Take 5 mg by mouth as needed.      . traZODone (DESYREL) 50 MG tablet Take 50 mg by mouth at bedtime.     No current facility-administered medications on file prior to visit.    Allergies  Allergen Reactions  . Aspirin Other (See Comments)    Pt has history of ulcers  . Codeine Nausea Only  . Latex Rash    No family history on file.  BP 128/78   Pulse 87   Ht 5\' 1"  (1.549 m)   Wt 104 lb 6.4 oz (47.4 kg)   SpO2 98%   BMI 19.73 kg/m    Review of Systems Denies falls    Objective:   Physical Exam VITAL SIGNS:  See vs page GENERAL: no distress GAIT: normal and steady.        Assessment & Plan:  Osteoporosis: due for recheck  Patient Instructions  Blood tests are requested for you today.  We'll let you know about the results.  Let's recheck the bone density.  you will receive a phone call, about a day and time for an appointment.  If it is much worse, we should add back the [place], in addition to the Prolia.  Your next Prolia shot is due on approx 11/04/19.   Please continue to take calcium at least 1200 mg per day, and vitamin-D, at least 400 units per day Please come back for a follow-up appointment in 1 year.

## 2019-09-20 NOTE — Patient Instructions (Addendum)
Blood tests are requested for you today.  We'll let you know about the results.  Let's recheck the bone density.  you will receive a phone call, about a day and time for an appointment.  If it is much worse, we should add back the [place], in addition to the Prolia.  Your next Prolia shot is due on approx 11/04/19.   Please continue to take calcium at least 1200 mg per day, and vitamin-D, at least 400 units per day Please come back for a follow-up appointment in 1 year.

## 2019-09-21 LAB — PTH, INTACT AND CALCIUM
Calcium: 9.4 mg/dL (ref 8.6–10.4)
PTH: 15 pg/mL (ref 14–64)

## 2019-10-07 ENCOUNTER — Telehealth: Payer: Self-pay

## 2019-10-07 NOTE — Telephone Encounter (Signed)
Patient has been submitted to [employer] Portal to verify for Prolia benefits and verification.

## 2019-10-13 ENCOUNTER — Telehealth: Payer: Self-pay | Admitting: Endocrinology

## 2019-10-13 DIAGNOSIS — M810 Age-related osteoporosis without current pathological fracture: Secondary | ICD-10-CM

## 2019-10-13 NOTE — Telephone Encounter (Signed)
Please refer to pt request below °

## 2019-10-13 NOTE — Telephone Encounter (Signed)
I placed order for [facility]

## 2019-10-13 NOTE — Telephone Encounter (Signed)
Summary of benefits indicates that the patient does have benefits for Prolia. Insurance does require a PA, but an active PA is on file and ends on 02/03/20 Berkley Harvey #:[id number]). Patient does not have a deductible, and does not have an out of pocket cost. Insurance covers medication and administration at 100%. Patient owes $0.  She can be scheduled anytime on or after 11/05/19.

## 2019-10-13 NOTE — Telephone Encounter (Signed)
Patient called to advise that she was contacted by provider at [facility] for [facility] BONE DENSITY scan and that they are out almost 1 year for scheduling.  She is requesting that we send this request to someone in [place] who could possible see her sooner.  Call back number for clarification or confirmation is [phone]

## 2019-10-19 NOTE — Telephone Encounter (Signed)
Called pt and left voicemail requesting a call back in order to schedule Prolia injection.

## 2019-10-24 DIAGNOSIS — R0981 Nasal congestion: Secondary | ICD-10-CM | POA: Diagnosis not present

## 2019-10-24 DIAGNOSIS — B9689 Other specified bacterial agents as the cause of diseases classified elsewhere: Secondary | ICD-10-CM | POA: Diagnosis not present

## 2019-10-24 DIAGNOSIS — R05 Cough: Secondary | ICD-10-CM | POA: Diagnosis not present

## 2019-10-24 DIAGNOSIS — J019 Acute sinusitis, unspecified: Secondary | ICD-10-CM | POA: Diagnosis not present

## 2019-11-04 NOTE — Telephone Encounter (Signed)
Called pt and left her a VM requesting she call back to schedule nurse visit for Prolia-she owes $0 at check-in and can be scheduled on or after 11/05/19

## 2019-11-16 NOTE — Telephone Encounter (Signed)
LMTCB to schedule nurse visit for Prolia

## 2019-11-17 ENCOUNTER — Ambulatory Visit (INDEPENDENT_AMBULATORY_CARE_PROVIDER_SITE_OTHER): Payer: Medicare PPO

## 2019-11-17 ENCOUNTER — Other Ambulatory Visit: Payer: Self-pay

## 2019-11-17 DIAGNOSIS — M810 Age-related osteoporosis without current pathological fracture: Secondary | ICD-10-CM | POA: Diagnosis not present

## 2019-11-17 MED ORDER — DENOSUMAB 60 MG/ML ~~LOC~~ SOSY
60.0000 mg | PREFILLED_SYRINGE | Freq: Once | SUBCUTANEOUS | Status: AC
Start: 2019-11-17 — End: 2019-11-17
  Administered 2019-11-17: 60 mg via SUBCUTANEOUS

## 2019-11-17 NOTE — Progress Notes (Signed)
Prolia injeciton given and tolerated well.

## 2019-11-24 DIAGNOSIS — K5730 Diverticulosis of large intestine without perforation or abscess without bleeding: Secondary | ICD-10-CM | POA: Diagnosis not present

## 2019-11-24 DIAGNOSIS — Z6820 Body mass index (BMI) 20.0-20.9, adult: Secondary | ICD-10-CM | POA: Diagnosis not present

## 2019-11-24 DIAGNOSIS — I700 Atherosclerosis of aorta: Secondary | ICD-10-CM | POA: Diagnosis not present

## 2019-11-24 DIAGNOSIS — K8020 Calculus of gallbladder without cholecystitis without obstruction: Secondary | ICD-10-CM | POA: Diagnosis not present

## 2019-11-24 DIAGNOSIS — R109 Unspecified abdominal pain: Secondary | ICD-10-CM | POA: Diagnosis not present

## 2019-11-24 DIAGNOSIS — K8080 Other cholelithiasis without obstruction: Secondary | ICD-10-CM | POA: Diagnosis not present

## 2019-11-24 DIAGNOSIS — Z87442 Personal history of urinary calculi: Secondary | ICD-10-CM | POA: Diagnosis not present

## 2019-12-05 DIAGNOSIS — K8010 Calculus of gallbladder with chronic cholecystitis without obstruction: Secondary | ICD-10-CM | POA: Diagnosis not present

## 2019-12-05 DIAGNOSIS — Z1159 Encounter for screening for other viral diseases: Secondary | ICD-10-CM | POA: Diagnosis not present

## 2019-12-05 DIAGNOSIS — Z20828 Contact with and (suspected) exposure to other viral communicable diseases: Secondary | ICD-10-CM | POA: Diagnosis not present

## 2019-12-06 ENCOUNTER — Ambulatory Visit: Payer: Medicare PPO | Admitting: Endocrinology

## 2019-12-09 DIAGNOSIS — I10 Essential (primary) hypertension: Secondary | ICD-10-CM | POA: Diagnosis not present

## 2019-12-09 DIAGNOSIS — E785 Hyperlipidemia, unspecified: Secondary | ICD-10-CM | POA: Diagnosis not present

## 2019-12-09 DIAGNOSIS — Z7982 Long term (current) use of aspirin: Secondary | ICD-10-CM | POA: Diagnosis not present

## 2019-12-09 DIAGNOSIS — J302 Other seasonal allergic rhinitis: Secondary | ICD-10-CM | POA: Diagnosis not present

## 2019-12-09 DIAGNOSIS — K219 Gastro-esophageal reflux disease without esophagitis: Secondary | ICD-10-CM | POA: Diagnosis not present

## 2019-12-09 DIAGNOSIS — Z79899 Other long term (current) drug therapy: Secondary | ICD-10-CM | POA: Diagnosis not present

## 2019-12-09 DIAGNOSIS — K8010 Calculus of gallbladder with chronic cholecystitis without obstruction: Secondary | ICD-10-CM | POA: Diagnosis not present

## 2019-12-09 DIAGNOSIS — K811 Chronic cholecystitis: Secondary | ICD-10-CM | POA: Diagnosis not present

## 2019-12-09 DIAGNOSIS — Z0389 Encounter for observation for other suspected diseases and conditions ruled out: Secondary | ICD-10-CM | POA: Diagnosis not present

## 2019-12-14 DIAGNOSIS — H43813 Vitreous degeneration, bilateral: Secondary | ICD-10-CM | POA: Diagnosis not present

## 2019-12-14 DIAGNOSIS — H26493 Other secondary cataract, bilateral: Secondary | ICD-10-CM | POA: Diagnosis not present

## 2019-12-14 DIAGNOSIS — H04123 Dry eye syndrome of bilateral lacrimal glands: Secondary | ICD-10-CM | POA: Diagnosis not present

## 2019-12-14 DIAGNOSIS — H524 Presbyopia: Secondary | ICD-10-CM | POA: Diagnosis not present

## 2020-03-22 DIAGNOSIS — H26491 Other secondary cataract, right eye: Secondary | ICD-10-CM | POA: Diagnosis not present

## 2020-03-23 DIAGNOSIS — J019 Acute sinusitis, unspecified: Secondary | ICD-10-CM | POA: Diagnosis not present

## 2020-03-23 DIAGNOSIS — Z681 Body mass index (BMI) 19 or less, adult: Secondary | ICD-10-CM | POA: Diagnosis not present

## 2020-03-23 DIAGNOSIS — I10 Essential (primary) hypertension: Secondary | ICD-10-CM | POA: Diagnosis not present

## 2020-03-23 DIAGNOSIS — B9689 Other specified bacterial agents as the cause of diseases classified elsewhere: Secondary | ICD-10-CM | POA: Diagnosis not present

## 2020-04-17 DIAGNOSIS — H26492 Other secondary cataract, left eye: Secondary | ICD-10-CM | POA: Diagnosis not present

## 2020-04-23 ENCOUNTER — Telehealth: Payer: Self-pay

## 2020-04-23 NOTE — Telephone Encounter (Signed)
Last OV 09/20/19 Last Prolia inj 11/17/19 Next inj due 05/18/20  VOB initiated via [employer] portal.

## 2020-04-25 DIAGNOSIS — Z961 Presence of intraocular lens: Secondary | ICD-10-CM | POA: Diagnosis not present

## 2020-04-30 NOTE — Telephone Encounter (Deleted)
PA Approved  PA# [id number] valid 03/28/20-02/02/21

## 2020-04-30 NOTE — Telephone Encounter (Signed)
PA REQUIRED  Prolia co-insurance: 0% Admin fee: $35  PRIMARY MEDICAL BENEFIT DETAILS (PHYSICIAN PURCHASE, OR REFERRAL TO TREATING SITE) COVERAGE AVAILABLE: Yes  COVERAGE DETAILS: Prolia is covered at 100%. The patient will be responsible for a $35.00 for administration. The benefits provided on this Verification of Benefits form are Medical Benefits and are the patient's In-Network benefits for Prolia. If you would like Pharmacy Benefits for Prolia, please call [phone].  AUTHORIZATION REQUIRED: Yes PA PROCESS DETAILS: PA is required. PA can be initiated by calling [phone] or online at [email or web address]

## 2020-05-02 NOTE — Telephone Encounter (Signed)
Called to schedule Prolia - patient said she would call right back.

## 2020-05-07 DIAGNOSIS — H01001 Unspecified blepharitis right upper eyelid: Secondary | ICD-10-CM | POA: Diagnosis not present

## 2020-05-07 DIAGNOSIS — H01004 Unspecified blepharitis left upper eyelid: Secondary | ICD-10-CM | POA: Diagnosis not present

## 2020-05-09 NOTE — Telephone Encounter (Signed)
Alisha Becker (Key: [id number])  Your information has been submitted to [facility]. [employer] will review the request and will issue a decision, typically within 3-7 days from your submission. You can check the updated outcome later by reopening this request.  If [employer] has not responded in 3-7 days or if you have any questions about your ePA request, please contact [employer] at [phone]. If you think there may be a problem with your PA request, use our live chat feature at the bottom right.  For [place] requests, please call [phone].

## 2020-05-11 NOTE — Telephone Encounter (Signed)
PA approved  PA# [id number] Valid 04/15/2019-02/02/2021

## 2020-05-11 NOTE — Telephone Encounter (Signed)
Pt ready for scheduling  Out-of-pocket cost due at time of visit: $35

## 2020-05-14 ENCOUNTER — Telehealth: Payer: Self-pay

## 2020-05-14 NOTE — Telephone Encounter (Signed)
There must be 6 months and 1 day between Prolia injections. Pt will need to be r/s for 05/18/20 or later for insurance to cover injection. Please contact pt to r/s.   Thank you!

## 2020-05-14 NOTE — Telephone Encounter (Signed)
PA has been approve thru [facility] for Prolia from 3/12/202-02/02/2021

## 2020-05-17 ENCOUNTER — Ambulatory Visit: Payer: Medicare PPO

## 2020-05-23 ENCOUNTER — Other Ambulatory Visit: Payer: Self-pay

## 2020-05-23 ENCOUNTER — Ambulatory Visit: Payer: Medicare PPO | Admitting: Endocrinology

## 2020-05-23 DIAGNOSIS — M810 Age-related osteoporosis without current pathological fracture: Secondary | ICD-10-CM | POA: Diagnosis not present

## 2020-05-23 MED ORDER — DENOSUMAB 60 MG/ML ~~LOC~~ SOSY
60.0000 mg | PREFILLED_SYRINGE | Freq: Once | SUBCUTANEOUS | Status: AC
  Administered 2020-05-23: 60 mg via SUBCUTANEOUS

## 2020-05-23 NOTE — Progress Notes (Signed)
Pt came in for Prolia 60mg /ml injection which was administered in the Rt arm w/o any complaints.

## 2020-06-06 DIAGNOSIS — M810 Age-related osteoporosis without current pathological fracture: Secondary | ICD-10-CM | POA: Diagnosis not present

## 2020-06-06 DIAGNOSIS — I10 Essential (primary) hypertension: Secondary | ICD-10-CM | POA: Diagnosis not present

## 2020-06-06 DIAGNOSIS — I951 Orthostatic hypotension: Secondary | ICD-10-CM | POA: Diagnosis not present

## 2020-06-06 DIAGNOSIS — Z6820 Body mass index (BMI) 20.0-20.9, adult: Secondary | ICD-10-CM | POA: Diagnosis not present

## 2020-06-14 DIAGNOSIS — H01001 Unspecified blepharitis right upper eyelid: Secondary | ICD-10-CM | POA: Diagnosis not present

## 2020-06-18 DIAGNOSIS — Z23 Encounter for immunization: Secondary | ICD-10-CM | POA: Diagnosis not present

## 2020-06-19 DIAGNOSIS — E782 Mixed hyperlipidemia: Secondary | ICD-10-CM | POA: Diagnosis not present

## 2020-06-19 DIAGNOSIS — Z6820 Body mass index (BMI) 20.0-20.9, adult: Secondary | ICD-10-CM | POA: Diagnosis not present

## 2020-06-19 DIAGNOSIS — I10 Essential (primary) hypertension: Secondary | ICD-10-CM | POA: Diagnosis not present

## 2020-06-19 DIAGNOSIS — L03019 Cellulitis of unspecified finger: Secondary | ICD-10-CM | POA: Diagnosis not present

## 2020-07-10 DIAGNOSIS — R309 Painful micturition, unspecified: Secondary | ICD-10-CM | POA: Diagnosis not present

## 2020-07-10 DIAGNOSIS — N760 Acute vaginitis: Secondary | ICD-10-CM | POA: Diagnosis not present

## 2020-08-14 DIAGNOSIS — I10 Essential (primary) hypertension: Secondary | ICD-10-CM | POA: Diagnosis not present

## 2020-08-14 DIAGNOSIS — Z6821 Body mass index (BMI) 21.0-21.9, adult: Secondary | ICD-10-CM | POA: Diagnosis not present

## 2020-08-14 DIAGNOSIS — H00019 Hordeolum externum unspecified eye, unspecified eyelid: Secondary | ICD-10-CM | POA: Diagnosis not present

## 2020-08-14 DIAGNOSIS — I889 Nonspecific lymphadenitis, unspecified: Secondary | ICD-10-CM | POA: Diagnosis not present

## 2020-08-14 DIAGNOSIS — Z0000 Encounter for general adult medical examination without abnormal findings: Secondary | ICD-10-CM | POA: Diagnosis not present

## 2020-08-21 DIAGNOSIS — L02412 Cutaneous abscess of left axilla: Secondary | ICD-10-CM | POA: Diagnosis not present

## 2020-09-18 DIAGNOSIS — M810 Age-related osteoporosis without current pathological fracture: Secondary | ICD-10-CM | POA: Diagnosis not present

## 2020-09-18 DIAGNOSIS — M545 Low back pain, unspecified: Secondary | ICD-10-CM | POA: Diagnosis not present

## 2020-09-18 DIAGNOSIS — Z6820 Body mass index (BMI) 20.0-20.9, adult: Secondary | ICD-10-CM | POA: Diagnosis not present

## 2020-09-26 ENCOUNTER — Other Ambulatory Visit: Payer: Self-pay

## 2020-09-26 ENCOUNTER — Ambulatory Visit: Payer: Medicare PPO | Admitting: Endocrinology

## 2020-09-26 VITALS — BP 136/50 | HR 80 | Ht 61.0 in | Wt 106.2 lb

## 2020-09-26 DIAGNOSIS — M810 Age-related osteoporosis without current pathological fracture: Secondary | ICD-10-CM

## 2020-09-26 NOTE — Patient Instructions (Addendum)
Please continue the same Vitamin-D pills.  Your next Prolia shot is due on approx 11/22/20.   Please continue to take calcium at least 1200 mg per day, and vitamin-D, at least 400 units per day.   Please come back for a follow-up appointment in 1 year.

## 2020-09-26 NOTE — Progress Notes (Signed)
Subjective:    Patient ID: Alisha Becker, female    DOB: 1950/05/18, 70 y.o.   MRN: [id number]  HPI Pt returns for f/u of osteoporosis:  Dx'ed: 2007 Secondary cause: none found Fractures: toe as a child, and left humerus in 1980 (both with injuries).   Past rx: boniva from 2008-2010, but she stopped due to lack of effect.  Current rx: prolia since 2011.   Last DEXA result (2022): worst T-score was -2.2 (RFN).   Other: she had urolithiasis in mid-2019.   Interval hx:  She has tolerated prolia well.  She takes Ca++ and Vit-D (still uncertain dosages).  pt states she feels well in general, except for low back pain.  No recent bony fracture.   Past Medical History:  Diagnosis Date   Headache(784.0)    Hypertension     Past Surgical History:  Procedure Laterality Date   ABDOMINOPLASTY     BLADDER SUSPENSION  08/12/2011   Procedure: TRANSVAGINAL TAPE (TVT) PROCEDURE;  Surgeon: Levi Aland, MD;  Location: [facility];  Service: Gynecology;  Laterality: N/A;   CYSTOSCOPY  08/12/2011   Procedure: CYSTOSCOPY;  Surgeon: Levi Aland, MD;  Location: [facility];  Service: Gynecology;  Laterality: N/A;   LAPAROSCOPIC GASTROTOMY W/ REPAIR OF ULCER     VAGINAL DELIVERY     x2   WRIST FRACTURE SURGERY      Social History   Socioeconomic History   Marital status: Divorced    Spouse name: Not on file   Number of children: Not on file   Years of education: Not on file   Highest education level: Not on file  Occupational History   Not on file  Tobacco Use   Smoking status: Never   Smokeless tobacco: Never  Substance and Sexual Activity   Alcohol use: Yes    Comment: occasionally   Drug use: No   Sexual activity: Not on file  Other Topics Concern   Not on file  Social History Narrative   Not on file   Social Determinants of Health   Financial Resource Strain: Not on file  Food Insecurity: Not on file  Transportation Needs: Not on file  Physical Activity: Not on file  Stress: Not  on file  Social Connections: Not on file  Intimate Partner Violence: Not on file    Current Outpatient Medications on File Prior to Visit  Medication Sig Dispense Refill   Biotin 1 MG CAPS Take 1 tablet by mouth daily.     butalbital-acetaminophen-caffeine (FIORICET, ESGIC) 50-325-40 MG per tablet Take 1 tablet by mouth every 4 (four) hours as needed.      Calcium Carb-Cholecalciferol (CALCIUM 1000 + D PO) Take 1 tablet by mouth daily.     citalopram (CELEXA) 20 MG tablet Take 20 mg by mouth daily.     DHA-EPA-Vitamin E (OMEGA-3 COMPLEX PO) Take 1 tablet by mouth daily.     lisinopril (ZESTRIL) 20 MG tablet Take 20 mg by mouth daily.     Multiple Vitamin (MULTIVITAMIN WITH MINERALS) TABS Take 0.5 tablets by mouth 2 (two) times daily.     Multiple Vitamins-Minerals (PRESERVISION AREDS 2 PO) Take 1 tablet by mouth daily.     PATADAY 0.2 % SOLN Place 1 drop into both eyes daily.      Probiotic Product (PROBIOTIC-10 ULTIMATE) CAPS Take 1 capsule by mouth daily.     rizatriptan (MAXALT) 5 MG tablet Take 5 mg by mouth as needed.  traZODone (DESYREL) 50 MG tablet Take 50 mg by mouth at bedtime.     No current facility-administered medications on file prior to visit.    Allergies  Allergen Reactions   Aspirin Other (See Comments)    Pt has history of ulcers   Codeine Nausea Only   Latex Rash    No family history on file.  BP (!) 136/50 (BP Location: Right Arm, Patient Position: Standing, Cuff Size: Normal)   Pulse 80   Ht 5\' 1"  (1.549 m)   Wt 106 lb 3.2 oz (48.2 kg)   SpO2 96%   BMI 20.07 kg/m    Review of Systems Denies falls    Objective:   Physical Exam GAIT: normal and steady.   outside test results are reviewed: Ca++=9.4    Assessment & Plan:  Vit-D def: we discussed checking again today.  However, as Ca++ is mid-normal, we'll hold off for now Osteoporosis: Please continue the same Prolia for now, but I told pt she is close to needing another med.    Patient  Instructions  Please continue the same Vitamin-D pills.  Your next Prolia shot is due on approx 11/22/20.   Please continue to take calcium at least 1200 mg per day, and vitamin-D, at least 400 units per day.   Please come back for a follow-up appointment in 1 year.

## 2020-09-28 DIAGNOSIS — M4003 Postural kyphosis, cervicothoracic region: Secondary | ICD-10-CM | POA: Diagnosis not present

## 2020-09-28 DIAGNOSIS — M546 Pain in thoracic spine: Secondary | ICD-10-CM | POA: Diagnosis not present

## 2020-09-28 DIAGNOSIS — M545 Low back pain, unspecified: Secondary | ICD-10-CM | POA: Diagnosis not present

## 2020-09-28 DIAGNOSIS — M79621 Pain in right upper arm: Secondary | ICD-10-CM | POA: Diagnosis not present

## 2020-09-28 DIAGNOSIS — M79622 Pain in left upper arm: Secondary | ICD-10-CM | POA: Diagnosis not present

## 2020-10-03 DIAGNOSIS — M4003 Postural kyphosis, cervicothoracic region: Secondary | ICD-10-CM | POA: Diagnosis not present

## 2020-10-03 DIAGNOSIS — M545 Low back pain, unspecified: Secondary | ICD-10-CM | POA: Diagnosis not present

## 2020-10-03 DIAGNOSIS — M79622 Pain in left upper arm: Secondary | ICD-10-CM | POA: Diagnosis not present

## 2020-10-03 DIAGNOSIS — M79621 Pain in right upper arm: Secondary | ICD-10-CM | POA: Diagnosis not present

## 2020-10-03 DIAGNOSIS — M546 Pain in thoracic spine: Secondary | ICD-10-CM | POA: Diagnosis not present

## 2020-10-04 NOTE — Telephone Encounter (Signed)
Prolia VOB initiated via [email or web address]  Last OV: 09/26/20 Next OV: 09/26/21 Last Prolia inj: 05/23/20 Next Prolia inj DUE: 11/23/20

## 2020-10-05 DIAGNOSIS — M79622 Pain in left upper arm: Secondary | ICD-10-CM | POA: Diagnosis not present

## 2020-10-05 DIAGNOSIS — M4003 Postural kyphosis, cervicothoracic region: Secondary | ICD-10-CM | POA: Diagnosis not present

## 2020-10-05 DIAGNOSIS — M546 Pain in thoracic spine: Secondary | ICD-10-CM | POA: Diagnosis not present

## 2020-10-05 DIAGNOSIS — M545 Low back pain, unspecified: Secondary | ICD-10-CM | POA: Diagnosis not present

## 2020-10-05 DIAGNOSIS — M79621 Pain in right upper arm: Secondary | ICD-10-CM | POA: Diagnosis not present

## 2020-10-08 NOTE — Telephone Encounter (Signed)
Pt ready for scheduling on or after 11/23/20  Out-of-pocket cost due at time of visit: $0.00  Primary: [employer] Prolia co-insurance: 0% Admin fee co-insurance: 0%  Secondary: n/a Prolia co-insurance:  Admin fee co-insurance:   Deductible: does not apply  Prior Auth: APPROVED PA# [id number] Valid: 04/15/19-02/02/21    ** This summary of benefits is an estimation of the patient's out-of-pocket cost. Exact cost may very based on individual plan coverage.

## 2020-10-09 DIAGNOSIS — M4003 Postural kyphosis, cervicothoracic region: Secondary | ICD-10-CM | POA: Diagnosis not present

## 2020-10-09 DIAGNOSIS — M79622 Pain in left upper arm: Secondary | ICD-10-CM | POA: Diagnosis not present

## 2020-10-09 DIAGNOSIS — M79621 Pain in right upper arm: Secondary | ICD-10-CM | POA: Diagnosis not present

## 2020-10-09 DIAGNOSIS — M546 Pain in thoracic spine: Secondary | ICD-10-CM | POA: Diagnosis not present

## 2020-10-09 DIAGNOSIS — M545 Low back pain, unspecified: Secondary | ICD-10-CM | POA: Diagnosis not present

## 2020-10-12 DIAGNOSIS — M79621 Pain in right upper arm: Secondary | ICD-10-CM | POA: Diagnosis not present

## 2020-10-12 DIAGNOSIS — M4003 Postural kyphosis, cervicothoracic region: Secondary | ICD-10-CM | POA: Diagnosis not present

## 2020-10-12 DIAGNOSIS — M79622 Pain in left upper arm: Secondary | ICD-10-CM | POA: Diagnosis not present

## 2020-10-12 DIAGNOSIS — M546 Pain in thoracic spine: Secondary | ICD-10-CM | POA: Diagnosis not present

## 2020-10-12 DIAGNOSIS — M545 Low back pain, unspecified: Secondary | ICD-10-CM | POA: Diagnosis not present

## 2020-11-20 DIAGNOSIS — M25512 Pain in left shoulder: Secondary | ICD-10-CM | POA: Diagnosis not present

## 2020-11-20 DIAGNOSIS — M25511 Pain in right shoulder: Secondary | ICD-10-CM | POA: Diagnosis not present

## 2020-11-20 DIAGNOSIS — M545 Low back pain, unspecified: Secondary | ICD-10-CM | POA: Diagnosis not present

## 2020-11-27 ENCOUNTER — Other Ambulatory Visit: Payer: Self-pay

## 2020-11-27 ENCOUNTER — Ambulatory Visit (INDEPENDENT_AMBULATORY_CARE_PROVIDER_SITE_OTHER): Payer: Medicare PPO

## 2020-11-27 DIAGNOSIS — M810 Age-related osteoporosis without current pathological fracture: Secondary | ICD-10-CM | POA: Diagnosis not present

## 2020-11-27 MED ORDER — DENOSUMAB 60 MG/ML ~~LOC~~ SOSY
60.0000 mg | PREFILLED_SYRINGE | Freq: Once | SUBCUTANEOUS | Status: AC
  Administered 2020-11-27: 60 mg via SUBCUTANEOUS

## 2020-11-28 NOTE — Progress Notes (Signed)
Pt came in for a Nurse visit to receive Prolia injection. Was administered in the Rt arm w/o any complaints.

## 2020-11-29 DIAGNOSIS — S161XXD Strain of muscle, fascia and tendon at neck level, subsequent encounter: Secondary | ICD-10-CM | POA: Diagnosis not present

## 2020-11-29 DIAGNOSIS — M6281 Muscle weakness (generalized): Secondary | ICD-10-CM | POA: Diagnosis not present

## 2020-11-29 DIAGNOSIS — S39012D Strain of muscle, fascia and tendon of lower back, subsequent encounter: Secondary | ICD-10-CM | POA: Diagnosis not present

## 2020-12-02 NOTE — Telephone Encounter (Signed)
Last Prolia inj 11/27/20 Next Prolia inj due 05/29/21 

## 2020-12-05 DIAGNOSIS — S161XXD Strain of muscle, fascia and tendon at neck level, subsequent encounter: Secondary | ICD-10-CM | POA: Diagnosis not present

## 2020-12-05 DIAGNOSIS — S39012D Strain of muscle, fascia and tendon of lower back, subsequent encounter: Secondary | ICD-10-CM | POA: Diagnosis not present

## 2020-12-05 DIAGNOSIS — M6281 Muscle weakness (generalized): Secondary | ICD-10-CM | POA: Diagnosis not present

## 2020-12-12 DIAGNOSIS — S39012D Strain of muscle, fascia and tendon of lower back, subsequent encounter: Secondary | ICD-10-CM | POA: Diagnosis not present

## 2020-12-12 DIAGNOSIS — M6281 Muscle weakness (generalized): Secondary | ICD-10-CM | POA: Diagnosis not present

## 2020-12-12 DIAGNOSIS — S161XXD Strain of muscle, fascia and tendon at neck level, subsequent encounter: Secondary | ICD-10-CM | POA: Diagnosis not present

## 2020-12-13 DIAGNOSIS — L650 Telogen effluvium: Secondary | ICD-10-CM | POA: Diagnosis not present

## 2020-12-13 DIAGNOSIS — B009 Herpesviral infection, unspecified: Secondary | ICD-10-CM | POA: Diagnosis not present

## 2020-12-18 DIAGNOSIS — M545 Low back pain, unspecified: Secondary | ICD-10-CM | POA: Diagnosis not present

## 2020-12-18 DIAGNOSIS — S46812A Strain of other muscles, fascia and tendons at shoulder and upper arm level, left arm, initial encounter: Secondary | ICD-10-CM | POA: Diagnosis not present

## 2020-12-18 DIAGNOSIS — M542 Cervicalgia: Secondary | ICD-10-CM | POA: Diagnosis not present

## 2020-12-18 DIAGNOSIS — S46811A Strain of other muscles, fascia and tendons at shoulder and upper arm level, right arm, initial encounter: Secondary | ICD-10-CM | POA: Diagnosis not present

## 2020-12-19 DIAGNOSIS — S39012D Strain of muscle, fascia and tendon of lower back, subsequent encounter: Secondary | ICD-10-CM | POA: Diagnosis not present

## 2020-12-19 DIAGNOSIS — S161XXD Strain of muscle, fascia and tendon at neck level, subsequent encounter: Secondary | ICD-10-CM | POA: Diagnosis not present

## 2020-12-19 DIAGNOSIS — M6281 Muscle weakness (generalized): Secondary | ICD-10-CM | POA: Diagnosis not present

## 2020-12-21 DIAGNOSIS — Z6820 Body mass index (BMI) 20.0-20.9, adult: Secondary | ICD-10-CM | POA: Diagnosis not present

## 2020-12-21 DIAGNOSIS — I10 Essential (primary) hypertension: Secondary | ICD-10-CM | POA: Diagnosis not present

## 2020-12-21 DIAGNOSIS — G43909 Migraine, unspecified, not intractable, without status migrainosus: Secondary | ICD-10-CM | POA: Diagnosis not present

## 2020-12-26 ENCOUNTER — Other Ambulatory Visit: Payer: Self-pay | Admitting: Obstetrics & Gynecology

## 2020-12-26 DIAGNOSIS — Z1231 Encounter for screening mammogram for malignant neoplasm of breast: Secondary | ICD-10-CM

## 2021-01-31 ENCOUNTER — Ambulatory Visit
Admission: RE | Admit: 2021-01-31 | Discharge: 2021-01-31 | Disposition: A | Discharge status: Home / Self Care | Payer: Medicare PPO | Source: Ambulatory Visit | Attending: Obstetrics & Gynecology | Admitting: Obstetrics & Gynecology

## 2021-01-31 DIAGNOSIS — Z1231 Encounter for screening mammogram for malignant neoplasm of breast: Secondary | ICD-10-CM | POA: Diagnosis not present

## 2021-02-06 DIAGNOSIS — Z6820 Body mass index (BMI) 20.0-20.9, adult: Secondary | ICD-10-CM | POA: Diagnosis not present

## 2021-02-06 DIAGNOSIS — Z01419 Encounter for gynecological examination (general) (routine) without abnormal findings: Secondary | ICD-10-CM | POA: Diagnosis not present

## 2021-02-25 DIAGNOSIS — Z6820 Body mass index (BMI) 20.0-20.9, adult: Secondary | ICD-10-CM | POA: Diagnosis not present

## 2021-02-25 DIAGNOSIS — Z1331 Encounter for screening for depression: Secondary | ICD-10-CM | POA: Diagnosis not present

## 2021-02-25 DIAGNOSIS — Z9181 History of falling: Secondary | ICD-10-CM | POA: Diagnosis not present

## 2021-02-25 DIAGNOSIS — I10 Essential (primary) hypertension: Secondary | ICD-10-CM | POA: Diagnosis not present

## 2021-02-25 DIAGNOSIS — G43909 Migraine, unspecified, not intractable, without status migrainosus: Secondary | ICD-10-CM | POA: Diagnosis not present

## 2021-03-05 DIAGNOSIS — I10 Essential (primary) hypertension: Secondary | ICD-10-CM | POA: Diagnosis not present

## 2021-03-05 DIAGNOSIS — E782 Mixed hyperlipidemia: Secondary | ICD-10-CM | POA: Diagnosis not present

## 2021-03-19 DIAGNOSIS — H6122 Impacted cerumen, left ear: Secondary | ICD-10-CM | POA: Diagnosis not present

## 2021-03-19 DIAGNOSIS — Z6820 Body mass index (BMI) 20.0-20.9, adult: Secondary | ICD-10-CM | POA: Diagnosis not present

## 2021-03-19 DIAGNOSIS — H6120 Impacted cerumen, unspecified ear: Secondary | ICD-10-CM | POA: Diagnosis not present

## 2021-03-19 DIAGNOSIS — H6980 Other specified disorders of Eustachian tube, unspecified ear: Secondary | ICD-10-CM | POA: Diagnosis not present

## 2021-03-21 DIAGNOSIS — H6123 Impacted cerumen, bilateral: Secondary | ICD-10-CM | POA: Diagnosis not present

## 2021-03-21 DIAGNOSIS — H60311 Diffuse otitis externa, right ear: Secondary | ICD-10-CM | POA: Diagnosis not present

## 2021-04-24 DIAGNOSIS — F419 Anxiety disorder, unspecified: Secondary | ICD-10-CM | POA: Diagnosis not present

## 2021-04-24 DIAGNOSIS — G4700 Insomnia, unspecified: Secondary | ICD-10-CM | POA: Diagnosis not present

## 2021-04-24 DIAGNOSIS — I10 Essential (primary) hypertension: Secondary | ICD-10-CM | POA: Diagnosis not present

## 2021-04-24 DIAGNOSIS — Z6821 Body mass index (BMI) 21.0-21.9, adult: Secondary | ICD-10-CM | POA: Diagnosis not present

## 2021-04-29 NOTE — Telephone Encounter (Signed)
Prolia VOB initiated via [email or web address] ? ?Last OV:  ?Next OV:  ?Last Prolia inj: 11/27/20 ?Next Prolia inj DUE: 05/29/21 ? ?

## 2021-04-30 DIAGNOSIS — H43813 Vitreous degeneration, bilateral: Secondary | ICD-10-CM | POA: Diagnosis not present

## 2021-04-30 DIAGNOSIS — H04123 Dry eye syndrome of bilateral lacrimal glands: Secondary | ICD-10-CM | POA: Diagnosis not present

## 2021-04-30 DIAGNOSIS — H524 Presbyopia: Secondary | ICD-10-CM | POA: Diagnosis not present

## 2021-04-30 DIAGNOSIS — H0100A Unspecified blepharitis right eye, upper and lower eyelids: Secondary | ICD-10-CM | POA: Diagnosis not present

## 2021-05-09 NOTE — Telephone Encounter (Signed)
Prior auth required for Prolia  PA PROCESS DETAILS: PA is required. PA can be initiated by calling [phone] or online at [email or web address]  

## 2021-05-10 NOTE — Telephone Encounter (Signed)
Prior auth renewal initiated via [email or web address] ?KEY: [id number] ? ? ?

## 2021-05-14 NOTE — Telephone Encounter (Signed)
Prior auth approved ?KEY/PA#: [id number] - PA Case ID: [id number] ? ? ?

## 2021-05-14 NOTE — Telephone Encounter (Signed)
Pt ready for scheduling on or after 05/29/21 ? ?Out-of-pocket cost due at time of visit: $0 ? ?Primary: [employer] ?Prolia co-insurance: 0% ?Admin fee co-insurance: 0% ? ?Secondary: n/a ?Prolia co-insurance:  ?Admin fee co-insurance:  ? ?Deductible: does not apply ? ?Prior Auth: APPROVED ?PA# [id number] ?Valid: 04/15/19-02/02/22 ?  ? ?** This summary of benefits is an estimation of the patient's out-of-pocket cost. Exact cost may very based on individual plan coverage.  ? ?

## 2021-05-17 DIAGNOSIS — E782 Mixed hyperlipidemia: Secondary | ICD-10-CM | POA: Diagnosis not present

## 2021-05-17 DIAGNOSIS — Z6821 Body mass index (BMI) 21.0-21.9, adult: Secondary | ICD-10-CM | POA: Diagnosis not present

## 2021-05-17 DIAGNOSIS — F419 Anxiety disorder, unspecified: Secondary | ICD-10-CM | POA: Diagnosis not present

## 2021-05-17 DIAGNOSIS — E559 Vitamin D deficiency, unspecified: Secondary | ICD-10-CM | POA: Diagnosis not present

## 2021-05-17 DIAGNOSIS — G43909 Migraine, unspecified, not intractable, without status migrainosus: Secondary | ICD-10-CM | POA: Diagnosis not present

## 2021-05-17 DIAGNOSIS — I10 Essential (primary) hypertension: Secondary | ICD-10-CM | POA: Diagnosis not present

## 2021-06-05 ENCOUNTER — Ambulatory Visit: Payer: Medicare PPO | Admitting: Endocrinology

## 2021-06-05 ENCOUNTER — Ambulatory Visit (INDEPENDENT_AMBULATORY_CARE_PROVIDER_SITE_OTHER): Payer: Medicare PPO

## 2021-06-05 VITALS — BP 130/70 | HR 80 | Ht 61.0 in | Wt 105.4 lb

## 2021-06-05 DIAGNOSIS — M810 Age-related osteoporosis without current pathological fracture: Secondary | ICD-10-CM

## 2021-06-05 DIAGNOSIS — N764 Abscess of vulva: Secondary | ICD-10-CM | POA: Diagnosis not present

## 2021-06-05 LAB — VITAMIN D 25 HYDROXY (VIT D DEFICIENCY, FRACTURES): VITD: 73.89 ng/mL (ref 30.00–100.00)

## 2021-06-05 LAB — T4, FREE: Free T4: 0.85 ng/dL (ref 0.60–1.60)

## 2021-06-05 LAB — BASIC METABOLIC PANEL
BUN: 13 mg/dL (ref 6–23)
CO2: 29 mEq/L (ref 19–32)
Calcium: 9.4 mg/dL (ref 8.4–10.5)
Chloride: 100 mEq/L (ref 96–112)
Creatinine, Ser: 0.67 mg/dL (ref 0.40–1.20)
GFR: 87.11 mL/min (ref >60.00)
Glucose, Bld: 84 mg/dL (ref 70–99)
Potassium: 3.9 mEq/L (ref 3.5–5.1)
Sodium: 137 mEq/L (ref 135–145)

## 2021-06-05 LAB — TSH: TSH: 2.88 u[IU]/mL (ref 0.35–5.50)

## 2021-06-05 MED ORDER — DENOSUMAB 60 MG/ML ~~LOC~~ SOSY
60.0000 mg | PREFILLED_SYRINGE | Freq: Once | SUBCUTANEOUS | Status: AC
  Administered 2021-06-05: 60 mg via SUBCUTANEOUS

## 2021-06-05 MED ORDER — DENOSUMAB 60 MG/ML ~~LOC~~ SOSY
60.0000 mg | PREFILLED_SYRINGE | Freq: Once | SUBCUTANEOUS | 0 refills | Status: DC
Start: 2021-06-05 — End: 2021-06-05

## 2021-06-05 NOTE — Patient Instructions (Addendum)
Please have the bone density rechecked soon at [facility].  We'll let you know about the results.   ?If this is low. Your options would be to add back the ibandronate, or to change to Evenity (monthly shot at the doctor's office, x 1 year).   ?Blood tests are requested for you today.  We'll let you know about the results.   ?You should have an endocrinology follow-up appointment in 1 year.   ?

## 2021-06-05 NOTE — Progress Notes (Signed)
? ?Subjective:  ? ? Patient ID: Alisha Becker, female    DOB: [birth year], 71 y.o.   MRN: [id number] ? ?HPI ?Pt returns for f/u of osteoporosis:  ?Dx'ed: 2007 ?Secondary cause: none found ?Fractures: toe as a child, and left humerus in 1980 (both with injuries).   ?Past rx: boniva from 2008-2010, but she stopped due to lack of effect.   ?Current rx: prolia since 2011.   ?Last DEXA result (2022): worst T-score was -2.2 (RFN).   ?Other: she had urolithiasis in mid-2019.   ?Interval hx:  She has tolerated prolia well.  She takes Ca++ and Vit-D (uncertain dosages).  pt states she feels well in general, except for bilat hip pain.  No recent bony fracture.   ?Past Medical History:  ?Diagnosis Date  ? Headache(784.0)   ? Hypertension   ? ? ?Past Surgical History:  ?Procedure Laterality Date  ? ABDOMINOPLASTY    ? BLADDER SUSPENSION  08/12/2011  ? Procedure: TRANSVAGINAL TAPE (TVT) PROCEDURE;  Surgeon: Levi Aland, MD;  Location: [facility];  Service: Gynecology;  Laterality: N/A;  ? CYSTOSCOPY  08/12/2011  ? Procedure: CYSTOSCOPY;  Surgeon: Levi Aland, MD;  Location: [facility];  Service: Gynecology;  Laterality: N/A;  ? LAPAROSCOPIC GASTROTOMY W/ REPAIR OF ULCER    ? VAGINAL DELIVERY    ? x2  ? WRIST FRACTURE SURGERY    ? ? ?Social History  ? ?Socioeconomic History  ? Marital status: Divorced  ?  Spouse name: Not on file  ? Number of children: Not on file  ? Years of education: Not on file  ? Highest education level: Not on file  ?Occupational History  ? Not on file  ?Tobacco Use  ? Smoking status: Never  ? Smokeless tobacco: Never  ?Substance and Sexual Activity  ? Alcohol use: Yes  ?  Comment: occasionally  ? Drug use: No  ? Sexual activity: Not on file  ?Other Topics Concern  ? Not on file  ?Social History Narrative  ? Not on file  ? ?Social Determinants of Health  ? ?Financial Resource Strain: Not on file  ?Food Insecurity: Not on file  ?Transportation Needs: Not on file  ?Physical Activity: Not on file  ?Stress: Not on  file  ?Social Connections: Not on file  ?Intimate Partner Violence: Not on file  ? ? ?Current Outpatient Medications on File Prior to Visit  ?Medication Sig Dispense Refill  ? Biotin 1 MG CAPS Take 1 tablet by mouth daily.    ? butalbital-acetaminophen-caffeine (FIORICET, ESGIC) 50-325-40 MG per tablet Take 1 tablet by mouth every 4 (four) hours as needed.     ? Calcium Carb-Cholecalciferol (CALCIUM 1000 + D PO) Take 1 tablet by mouth daily.    ? citalopram (CELEXA) 20 MG tablet Take 20 mg by mouth daily.    ? DHA-EPA-Vitamin E (OMEGA-3 COMPLEX PO) Take 1 tablet by mouth daily.    ? lisinopril (ZESTRIL) 20 MG tablet Take 20 mg by mouth daily.    ? Multiple Vitamin (MULTIVITAMIN WITH MINERALS) TABS Take 0.5 tablets by mouth 2 (two) times daily.    ? Multiple Vitamins-Minerals (PRESERVISION AREDS 2 PO) Take 1 tablet by mouth daily.    ? PATADAY 0.2 % SOLN Place 1 drop into both eyes daily.     ? Probiotic Product (PROBIOTIC-10 ULTIMATE) CAPS Take 1 capsule by mouth daily.    ? rizatriptan (MAXALT) 5 MG tablet Take 5 mg by mouth as needed.     ?  traZODone (DESYREL) 50 MG tablet Take 50 mg by mouth at bedtime.    ? ?No current facility-administered medications on file prior to visit.  ? ? ?Allergies  ?Allergen Reactions  ? Aspirin Other (See Comments)  ?  Pt has history of ulcers  ? Codeine Nausea Only  ? Latex Rash  ? ? ?No family history on file. ? ?BP 130/70 (BP Location: Left Arm, Patient Position: Sitting, Cuff Size: Normal)   Pulse 80   Ht 5\' 1"  (1.549 m)   Wt 105 lb 6.4 oz (47.8 kg)   SpO2 95%   BMI 19.92 kg/m?  ? ? ?Review of Systems ? ?   ?Objective:  ? Physical Exam ?VITAL SIGNS:  See vs page.   ?GENERAL: no distress.   ?GAIT: normal and steady.   ? ? ?25-OH Vit-D=73 ?   ?Assessment & Plan:  ?Osteoporosis: due for recheck ?Vit-D def: well-controlled.  Please continue the same Vit-D supplement.  ? ?Patient Instructions  ?Please have the bone density rechecked soon at [facility].  We'll let you know about  the results.   ?If this is low. Your options would be to add back the ibandronate, or to change to Evenity (monthly shot at the doctor's office, x 1 year).   ?Blood tests are requested for you today.  We'll let you know about the results.   ?You should have an endocrinology follow-up appointment in 1 year.   ? ? ?

## 2021-06-05 NOTE — Progress Notes (Signed)
Patient seen today for Prolia injection.  60mg /ml given in right arm subQ.  Patient tolerated well.  Follow up 6 months or as advised by provider.  ? ?Verbal consent given ?

## 2021-06-06 LAB — PTH, INTACT AND CALCIUM
Calcium: 9.6 mg/dL (ref 8.6–10.4)
PTH: 20 pg/mL (ref 16–77)

## 2021-06-11 DIAGNOSIS — N764 Abscess of vulva: Secondary | ICD-10-CM | POA: Diagnosis not present

## 2021-06-18 DIAGNOSIS — L650 Telogen effluvium: Secondary | ICD-10-CM | POA: Diagnosis not present

## 2021-06-25 DIAGNOSIS — M545 Low back pain, unspecified: Secondary | ICD-10-CM | POA: Diagnosis not present

## 2021-06-25 DIAGNOSIS — Z681 Body mass index (BMI) 19 or less, adult: Secondary | ICD-10-CM | POA: Diagnosis not present

## 2021-06-25 DIAGNOSIS — I10 Essential (primary) hypertension: Secondary | ICD-10-CM | POA: Diagnosis not present

## 2021-06-25 DIAGNOSIS — M1990 Unspecified osteoarthritis, unspecified site: Secondary | ICD-10-CM | POA: Diagnosis not present

## 2021-06-25 DIAGNOSIS — M8580 Other specified disorders of bone density and structure, unspecified site: Secondary | ICD-10-CM | POA: Diagnosis not present

## 2021-06-28 DIAGNOSIS — M47816 Spondylosis without myelopathy or radiculopathy, lumbar region: Secondary | ICD-10-CM | POA: Diagnosis not present

## 2021-07-01 NOTE — Telephone Encounter (Signed)
Last Prolia inj 06/05/21 Next Prolia inj due 12/07/21 

## 2021-07-18 DIAGNOSIS — M47816 Spondylosis without myelopathy or radiculopathy, lumbar region: Secondary | ICD-10-CM | POA: Diagnosis not present

## 2021-08-01 LAB — COLOGUARD

## 2021-08-13 DIAGNOSIS — M5451 Vertebrogenic low back pain: Secondary | ICD-10-CM | POA: Diagnosis not present

## 2021-08-19 DIAGNOSIS — M545 Low back pain, unspecified: Secondary | ICD-10-CM | POA: Diagnosis not present

## 2021-08-26 DIAGNOSIS — M47816 Spondylosis without myelopathy or radiculopathy, lumbar region: Secondary | ICD-10-CM | POA: Diagnosis not present

## 2021-09-26 ENCOUNTER — Ambulatory Visit: Payer: Medicare PPO | Admitting: Endocrinology

## 2021-09-30 DIAGNOSIS — Z0000 Encounter for general adult medical examination without abnormal findings: Secondary | ICD-10-CM | POA: Diagnosis not present

## 2021-09-30 DIAGNOSIS — Z1211 Encounter for screening for malignant neoplasm of colon: Secondary | ICD-10-CM | POA: Diagnosis not present

## 2021-09-30 DIAGNOSIS — Z1212 Encounter for screening for malignant neoplasm of rectum: Secondary | ICD-10-CM | POA: Diagnosis not present

## 2021-10-08 LAB — COLOGUARD: COLOGUARD: NEGATIVE

## 2021-11-02 DIAGNOSIS — E782 Mixed hyperlipidemia: Secondary | ICD-10-CM | POA: Diagnosis not present

## 2021-11-02 DIAGNOSIS — I10 Essential (primary) hypertension: Secondary | ICD-10-CM | POA: Diagnosis not present

## 2021-11-09 NOTE — Telephone Encounter (Signed)
Prolia VOB initiated via [email or web address]  Last Prolia inj 06/05/21 Next Prolia inj due 12/07/21  

## 2021-11-27 DIAGNOSIS — N760 Acute vaginitis: Secondary | ICD-10-CM | POA: Diagnosis not present

## 2021-11-29 NOTE — Telephone Encounter (Signed)
Pt ready for scheduling on or after 12/07/21   Out-of-pocket cost due at time of visit: $40   Primary: [employer] Medicare Adv PPO Prolia co-insurance: 0% Admin fee co-insurance:$40   Secondary: n/a Prolia co-insurance:  Admin fee co-insurance:    Deductible: does not apply   Prior Auth: APPROVED PA# [id number] Valid: 04/15/19-02/02/22     ** This summary of benefits is an estimation of the patient's out-of-pocket cost. Exact cost may very based on individual plan coverage.

## 2021-12-05 ENCOUNTER — Telehealth: Payer: Self-pay | Admitting: Internal Medicine

## 2021-12-05 ENCOUNTER — Ambulatory Visit: Payer: Medicare PPO | Admitting: Internal Medicine

## 2021-12-05 NOTE — Telephone Encounter (Signed)
Patient arrived at 155P for her 120P appointment.  I asked provider via her medical assistant if patient could still be seen and was advised that "if she can wait until 3, Dr Cruzita Lederer has a little break, if not we would need to reschedule".  I told patient the options and she stated that she was just here for an injection.  Patient advised after that statement that she was actually here for a visit with the doctor not for Prolia, because the Prolia was scheduled for 12/10/21.  She asked "why would we schedule visits just several days apart and not combine" I attempted to explain that the appointment for 12/05/21 was made on 06/05/21 after her last appointment with Dr Loanne Drilling and that Prolia was specific to [facility] plus 1 day for administration in order for the insurance to cover.  The Prolia was not due until 12/07/21 or later and the appointment was made on 12/02/21. There would not have been the opportunity to combine visits when she was contacted to schedule Prolia so that option could not have been offered at that time.  At this point I was going to let her know that we had a cancellation for 12/10/21 and that we could if she liked combine them now since we have had a cancellation but she stopped me and said no cancel both appointments.  I confirmed with Dr Arman Filter MA whether to cancel or No Show/Late Cancel todays appointment and was advised to No Show.

## 2021-12-05 NOTE — Progress Notes (Deleted)
Patient ID: Alisha Becker, female   DOB: 1950-08-07, 71 y.o.   MRN: [id number]  HPI  Alisha Becker is a 71 y.o.-year-old female, return for follow-up  for osteoporosis (OP).  She was previously seen by Dr. Everardo All, last visit 6 months ago.  Pt was dx with OP in 2007.  I reviewed pt's DXA scans: Date L1-L4 T score FN T score FRAX 33% distal radius  06/06/2020 [facility] [employer]) -0.3 RFN: -2.2 LFN: n/a MOF: 13% Hip fracture risk 2.9% N/a  04/16/2018 [facility]) N/a RFN: -1.8 LFN: n/a N/a -1.8  08/22/2015 ([facility]) -0.3 RFN: -1.8 LFN: N/a N/a   She denies recent fractures.  She had fractures as a child.  No falls.  No dizziness/vertigo/orthostasis/poor vision.   Previous OP treatments:  - Prolia - started 2011 Last 2 doses: 11/27/2020, 06/05/2021  At last visit, Dr. Everardo All recommended Boniva or [place], but she did not start these yet.  No h/o vitamin D deficiency. Reviewed available vit D levels: Lab Results  Component Value Date   VD25OH 73.89 06/05/2021   VD25OH 47.27 09/20/2019   VD25OH 47.50 09/20/2018   VD25OH 32.95 09/16/2017   VD25OH 54 05/23/2013   Pt is on: - calcium - vitamin D   No weight bearing exercises.   She does have a history of chronic bilateral hip pain.  She does not take high vitamin A doses.  Menopause was at *** y/o.   FH of osteoporosis: ***.  + h/o kidney stones in 2019.  No h/o hyper/hypocalcemia or hyperparathyroidism.  Lab Results  Component Value Date   PTH 20 06/05/2021   PTH 15 09/20/2019   PTH 39 09/20/2018   PTH 15 09/16/2017   PTH 21.5 05/23/2013   PTH 13.3 (L) 06/04/2010   CALCIUM 9.6 06/05/2021   CALCIUM 9.4 06/05/2021   CALCIUM 9.4 09/20/2019   CALCIUM 9.4 09/20/2019   CALCIUM 8.8 09/20/2018   CALCIUM 8.7 09/20/2018   CALCIUM 10.0 09/16/2017   CALCIUM 9.6 05/23/2013   CALCIUM 9.2 08/06/2011   CALCIUM 9.5 06/04/2010   No h/o thyrotoxicosis. Reviewed TSH recent levels:  Lab Results   Component Value Date   TSH 2.88 06/05/2021   TSH 1.37 05/23/2013   No h/o CKD. Last BUN/Cr: Lab Results  Component Value Date   BUN 13 06/05/2021   CREATININE 0.67 06/05/2021   SPEP did not show an M spike: Component     Latest Ref Rng 06/04/2010  Total Protein, Serum Electrophoresis     6.0 - 8.3 g/dL 7.4   Albumin ELP     77.8 - 66.1 % 58.1   Alpha-1-Globulin     2.9 - 4.9 % 5.2 (H)   Alpha-2-Globulin     7.1 - 11.8 % 12.2 (H)   Beta Globulin     4.7 - 7.2 % 7.5 (H)   Beta 2     3.2 - 6.5 % 5.6   Gamma Globulin     11.1 - 18.8 % 11.4   M-SPIKE, %     g/dL NOT DET    ROS: + see HPI  I reviewed pt's medications, allergies, PMH, social hx, family hx, and changes were documented in the history of present illness. Otherwise, unchanged from my initial visit note.  Past Medical History:  Diagnosis Date   Headache(784.0)    Hypertension    Past Surgical History:  Procedure Laterality Date   ABDOMINOPLASTY     BLADDER SUSPENSION  08/12/2011  Procedure: TRANSVAGINAL TAPE (TVT) PROCEDURE;  Surgeon: Olga Millers, MD;  Location: [facility];  Service: Gynecology;  Laterality: N/A;   CYSTOSCOPY  08/12/2011   Procedure: CYSTOSCOPY;  Surgeon: Olga Millers, MD;  Location: [facility];  Service: Gynecology;  Laterality: N/A;   LAPAROSCOPIC GASTROTOMY W/ REPAIR OF ULCER     VAGINAL DELIVERY     x2   WRIST FRACTURE SURGERY     Social History   Socioeconomic History   Marital status: Divorced    Spouse name: Not on file   Number of children: Not on file   Years of education: Not on file   Highest education level: Not on file  Occupational History   Not on file  Tobacco Use   Smoking status: Never   Smokeless tobacco: Never  Substance and Sexual Activity   Alcohol use: Yes    Comment: occasionally   Drug use: No   Sexual activity: Not on file  Other Topics Concern   Not on file  Social History Narrative   Not on file   Social Determinants of Health   Financial  Resource Strain: Not on file  Food Insecurity: Not on file  Transportation Needs: Not on file  Physical Activity: Not on file  Stress: Not on file  Social Connections: Not on file  Intimate Partner Violence: Not on file   Current Outpatient Medications on File Prior to Visit  Medication Sig Dispense Refill   Biotin 1 MG CAPS Take 1 tablet by mouth daily.     butalbital-acetaminophen-caffeine (FIORICET, ESGIC) 50-325-40 MG per tablet Take 1 tablet by mouth every 4 (four) hours as needed.      Calcium Carb-Cholecalciferol (CALCIUM 1000 + D PO) Take 1 tablet by mouth daily.     citalopram (CELEXA) 20 MG tablet Take 20 mg by mouth daily.     DHA-EPA-Vitamin E (OMEGA-3 COMPLEX PO) Take 1 tablet by mouth daily.     lisinopril (ZESTRIL) 20 MG tablet Take 20 mg by mouth daily.     Multiple Vitamin (MULTIVITAMIN WITH MINERALS) TABS Take 0.5 tablets by mouth 2 (two) times daily.     Multiple Vitamins-Minerals (PRESERVISION AREDS 2 PO) Take 1 tablet by mouth daily.     PATADAY 0.2 % SOLN Place 1 drop into both eyes daily.      Probiotic Product (PROBIOTIC-10 ULTIMATE) CAPS Take 1 capsule by mouth daily.     rizatriptan (MAXALT) 5 MG tablet Take 5 mg by mouth as needed.      traZODone (DESYREL) 50 MG tablet Take 50 mg by mouth at bedtime.     No current facility-administered medications on file prior to visit.   Allergies  Allergen Reactions   Aspirin Other (See Comments)    Pt has history of ulcers   Codeine Nausea Only   Latex Rash   No family history on file.  PE: There were no vitals taken for this visit. Wt Readings from Last 3 Encounters:  06/05/21 105 lb 6.4 oz (47.8 kg)  09/26/20 106 lb 3.2 oz (48.2 kg)  09/20/19 104 lb 6.4 oz (47.4 kg)   Constitutional: thin, in NAD. No kyphosis. Eyes:  EOMI, no exophthalmos ENT: no neck masses, no cervical lymphadenopathy Cardiovascular: RRR, No MRG Respiratory: CTA B Musculoskeletal: no deformities Skin:no rashes Neurological: no tremor  with outstretched hands  Assessment: 1. Osteoporosis  Plan: 1. Osteoporosis - likely postmenopausal/age-related, she has FH of OP - Discussed about increased risk of fracture, depending on the T  score, greatly increased when the T score is lower than -2.5, but it is actually a continuum and -2.5 should not be regarded as an absolute threshold. We reviewed her DXA scan report together, and I explained that based on the T scores, she has an increased risk for fractures.  - we reviewed her dietary and supplemental calcium and vitamin D intake. I recommended to make sure she gets 1000-1200 mg of calcium daily preferentially from diet and I will check vit D today to see if she needs supplementation. - discussed fall precautions   - given handout from [facility] Re: weight bearing exercises - advised to do this every day or at least 5/7 days - I also recommended the [facility] - for skeletal loading. Given brochure and explained benefits. - we discussed about maintaining a good amount of protein in her diet. The recommended daily protein intake is ~0.8 g per kilogram per day. I advised her to try to aim for this amount, since a diet low in proteins can exacerbate osteoporosis. Also, avoid smoking or >2 drinks of alcohol a day. - I recommended the following book for the concept of low acid eating:  - We discussed about the different medication classes, benefits and side effects (including atypical fractures and ONJ - no dental workup in progress or planned).  - I explained that first options are sq denosumab (Prolia) sq every 6 months for 6-10 years and zoledronic acid (Reclast) iv 1x a year for 1-2 years. I would use Teriparatide/Abaloparatide (which are daily subcu medication) or Romosozumab (monthly) as a last resort.  - Pt was given reading information about Prolia, and I explained the mechanism of action and expected benefits.  - Will check the following labs today: No  orders of the defined types were placed in this encounter.  - if labs normal, will arrange for a Prolia inj - will check a new DXA scan in 2 years after starting Prolia -  I explained that the first indication that the treatment is working is her not having anymore fractures. DEXA scan changes are secondary: unchanged or slightly higher T-scores are desirable - will see pt back in a year  Carlus Pavlov, MD PhD [facility] Endocrinology

## 2021-12-10 ENCOUNTER — Ambulatory Visit: Payer: Medicare PPO

## 2022-01-07 DIAGNOSIS — H5713 Ocular pain, bilateral: Secondary | ICD-10-CM | POA: Diagnosis not present

## 2022-01-08 DIAGNOSIS — M810 Age-related osteoporosis without current pathological fracture: Secondary | ICD-10-CM | POA: Diagnosis not present

## 2022-01-08 DIAGNOSIS — B009 Herpesviral infection, unspecified: Secondary | ICD-10-CM | POA: Diagnosis not present

## 2022-02-20 NOTE — Telephone Encounter (Signed)
Alisha Becker   [facility]   12/05/21  2:29 PM Note Patient arrived at 155P for her 120P appointment.  I asked provider via her medical assistant if patient could still be seen and was advised that "if she can wait until 3, Dr Cruzita Lederer has a little break, if not we would need to reschedule".  I told patient the options and she stated that she was just here for an injection.  Patient advised after that statement that she was actually here for a visit with the doctor not for Prolia, because the Prolia was scheduled for 12/10/21.  She asked "why would we schedule visits just several days apart and not combine" I attempted to explain that the appointment for 12/05/21 was made on 06/05/21 after her last appointment with Dr Loanne Drilling and that Prolia was specific to [facility] plus 1 day for administration in order for the insurance to cover.  The Prolia was not due until 12/07/21 or later and the appointment was made on 12/02/21. There would not have been the opportunity to combine visits when she was contacted to schedule Prolia so that option could not have been offered at that time.  At this point I was going to let her know that we had a cancellation for 12/10/21 and that we could if she liked combine them now since we have had a cancellation but she stopped me and said no cancel both appointments.  I confirmed with Dr Arman Filter MA whether to cancel or No Show/Late Cancel todays appointment and was advised to No Show.

## 2022-02-28 DIAGNOSIS — Z681 Body mass index (BMI) 19 or less, adult: Secondary | ICD-10-CM | POA: Diagnosis not present

## 2022-02-28 DIAGNOSIS — M1990 Unspecified osteoarthritis, unspecified site: Secondary | ICD-10-CM | POA: Diagnosis not present

## 2022-02-28 DIAGNOSIS — M545 Low back pain, unspecified: Secondary | ICD-10-CM | POA: Diagnosis not present

## 2022-02-28 DIAGNOSIS — G8929 Other chronic pain: Secondary | ICD-10-CM | POA: Diagnosis not present

## 2022-02-28 DIAGNOSIS — M810 Age-related osteoporosis without current pathological fracture: Secondary | ICD-10-CM | POA: Diagnosis not present

## 2022-03-03 IMAGING — MG MM DIGITAL SCREENING BILAT W/ TOMO AND CAD
8 series · 9 of 24 positions shown · non-contrast
Comparison: Previous exam(s).

CLINICAL DATA: Screening.

EXAM:
DIGITAL SCREENING BILATERAL MAMMOGRAM WITH TOMOSYNTHESIS AND CAD
TECHNIQUE: Bilateral screening digital craniocaudal and mediolateral oblique
mammograms were obtained. Bilateral screening digital breast
tomosynthesis was performed. The images were evaluated with
computer-aided detection.

[L CC synth-2D]
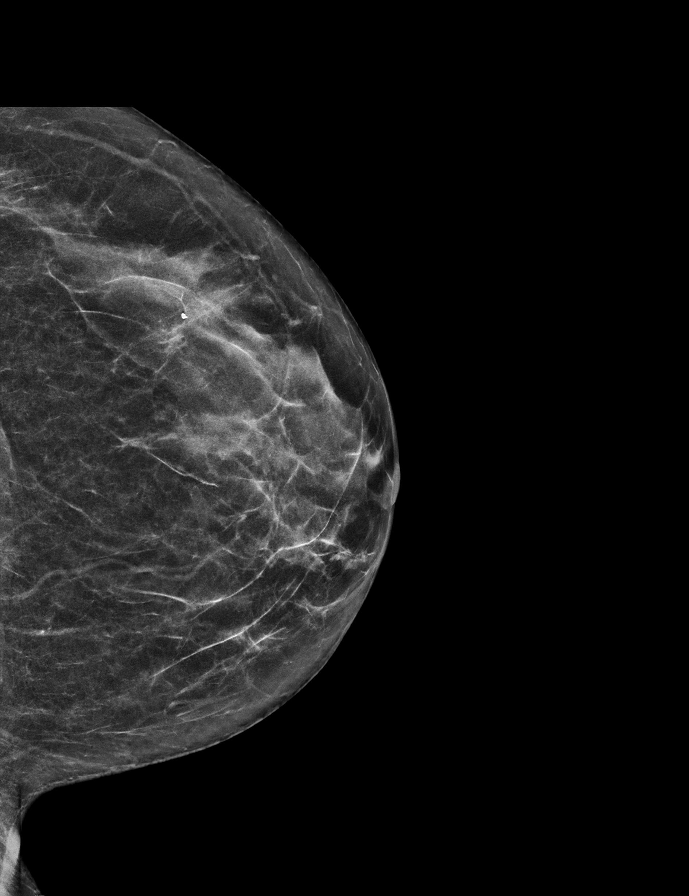

[R MLO synth-2D]
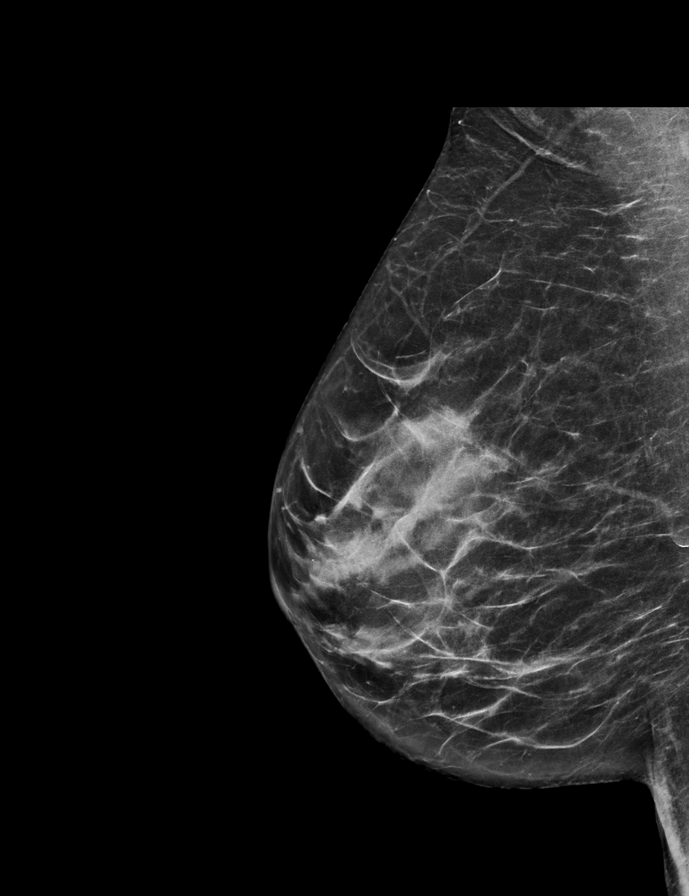

[R CC synth-2D]
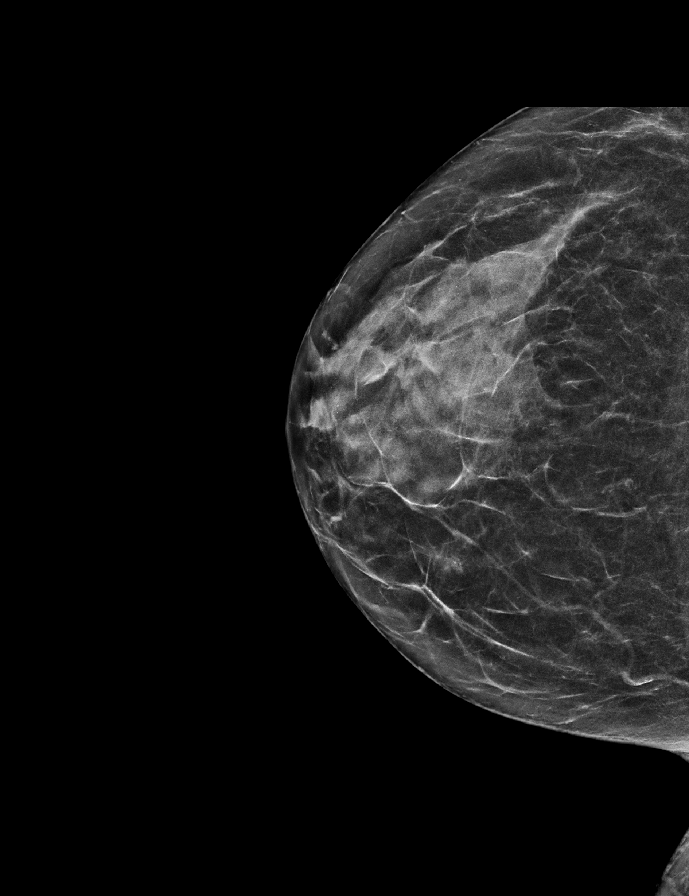

[L MLO synth-2D]
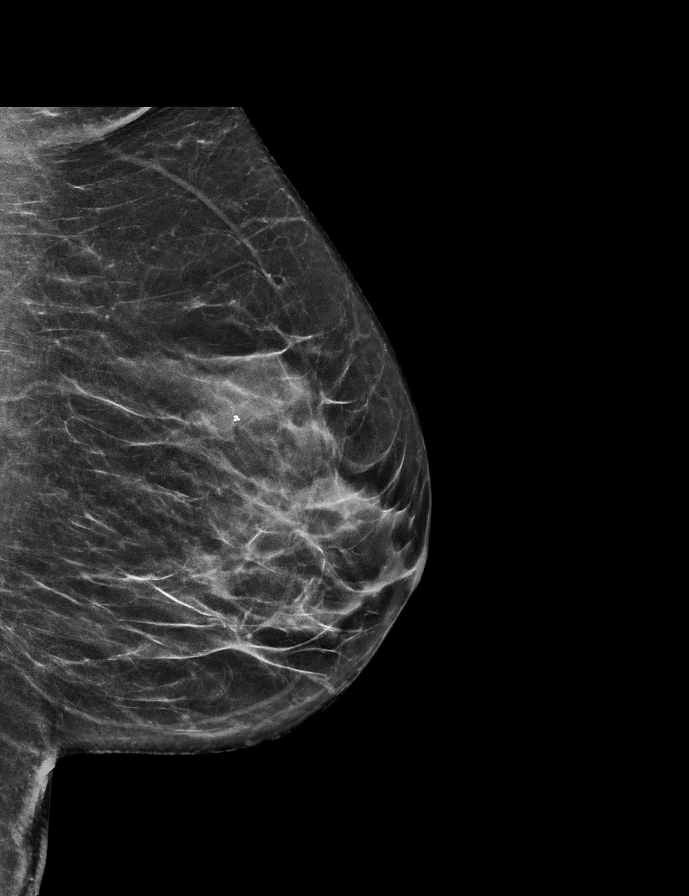

[R MLO tomo · 2 of 56 frames shown]
[frame 19/56]
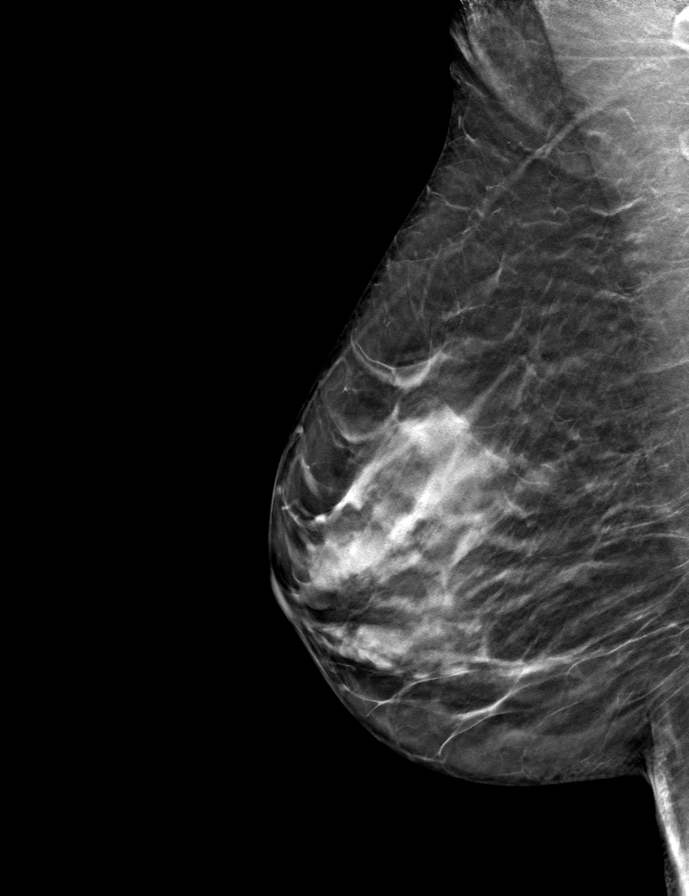
[frame 29/56]
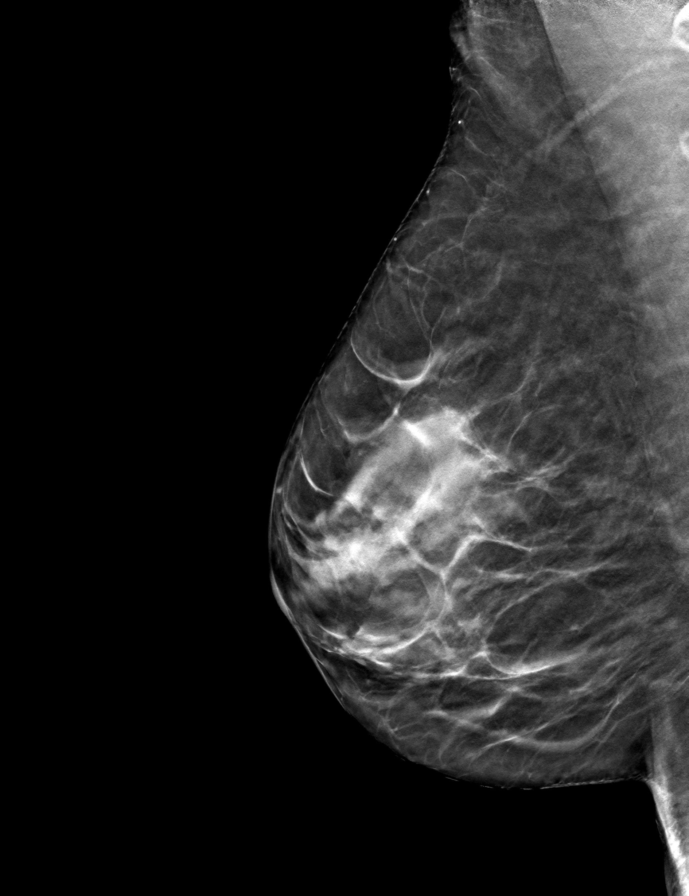

[R CC tomo · tomo slice 27/52.0]
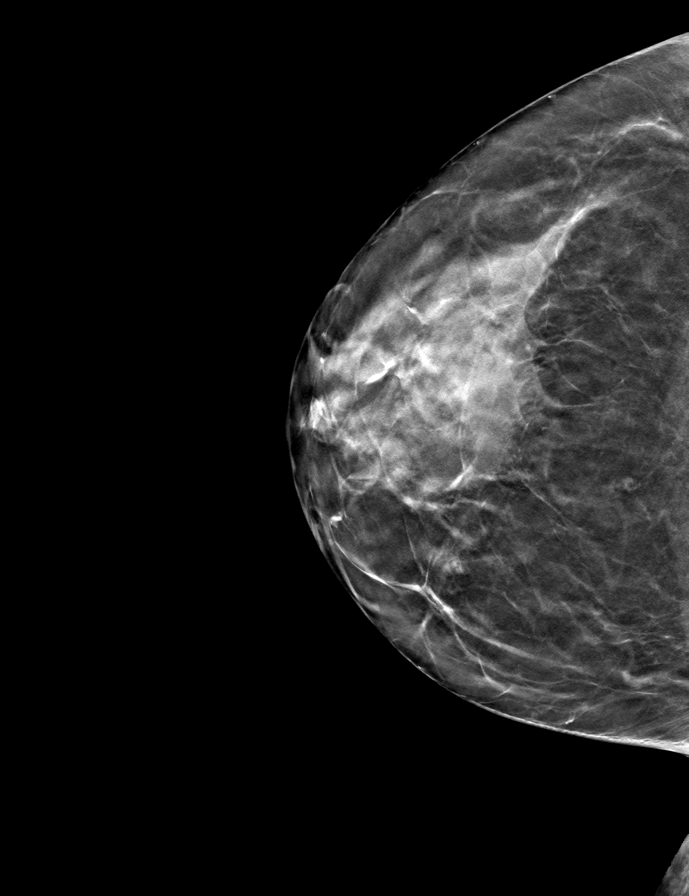

[L MLO tomo · tomo slice 29/56.0]
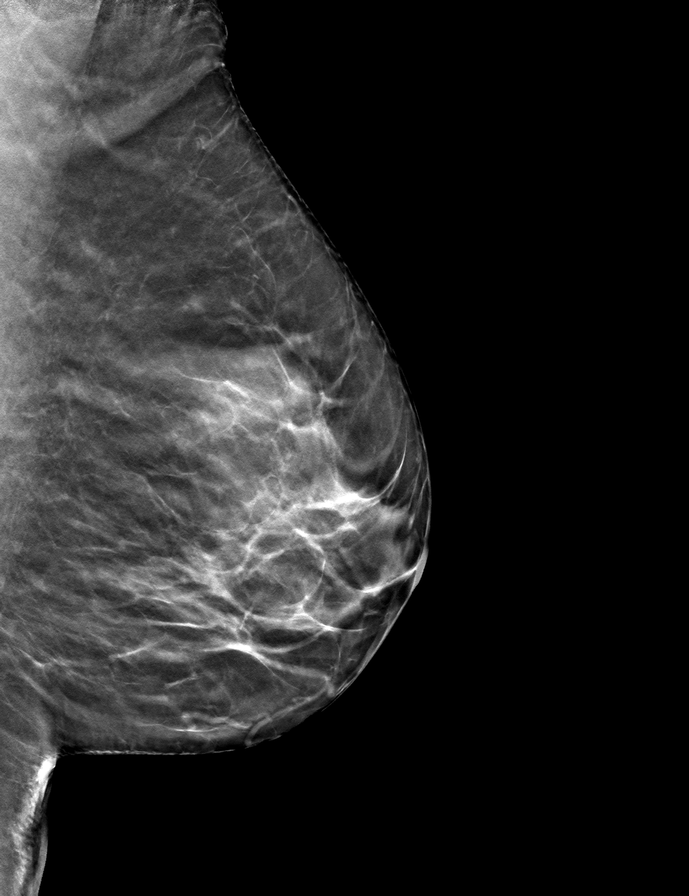

[L CC tomo · tomo slice 27/52.0]
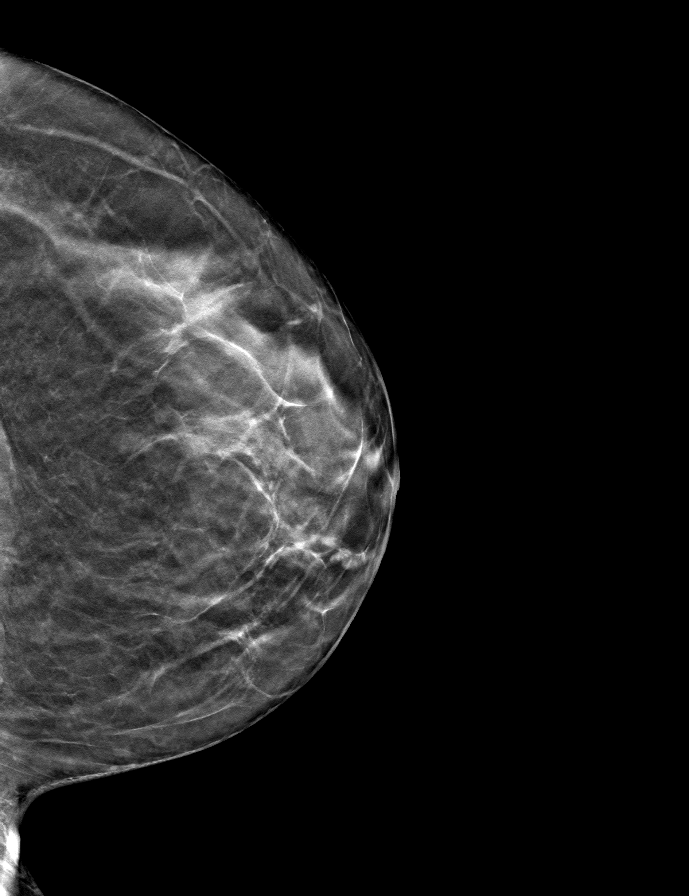

[9 of 24 positions shown; findings below may reference images not displayed]

ACR Breast Density Category c: The breast tissue is heterogeneously
dense, which may obscure small masses.
FINDINGS: There are no findings suspicious for malignancy.
IMPRESSION: No mammographic evidence of malignancy. A result letter of this
screening mammogram will be mailed directly to the patient.

RECOMMENDATION:
Screening mammogram in one year. (Code:Q3-W-BC3)

BI-RADS CATEGORY  1: Negative.

## 2022-03-20 DIAGNOSIS — M542 Cervicalgia: Secondary | ICD-10-CM | POA: Diagnosis not present

## 2022-03-20 DIAGNOSIS — M62838 Other muscle spasm: Secondary | ICD-10-CM | POA: Diagnosis not present

## 2022-03-20 DIAGNOSIS — Z681 Body mass index (BMI) 19 or less, adult: Secondary | ICD-10-CM | POA: Diagnosis not present

## 2022-03-20 DIAGNOSIS — M545 Low back pain, unspecified: Secondary | ICD-10-CM | POA: Diagnosis not present

## 2022-03-20 DIAGNOSIS — G8929 Other chronic pain: Secondary | ICD-10-CM | POA: Diagnosis not present

## 2022-03-26 DIAGNOSIS — G8929 Other chronic pain: Secondary | ICD-10-CM | POA: Diagnosis not present

## 2022-03-26 DIAGNOSIS — M542 Cervicalgia: Secondary | ICD-10-CM | POA: Diagnosis not present

## 2022-03-26 DIAGNOSIS — M62838 Other muscle spasm: Secondary | ICD-10-CM | POA: Diagnosis not present

## 2022-03-26 DIAGNOSIS — M545 Low back pain, unspecified: Secondary | ICD-10-CM | POA: Diagnosis not present

## 2022-04-17 DIAGNOSIS — M542 Cervicalgia: Secondary | ICD-10-CM | POA: Diagnosis not present

## 2022-04-17 DIAGNOSIS — Z5181 Encounter for therapeutic drug level monitoring: Secondary | ICD-10-CM | POA: Diagnosis not present

## 2022-04-17 DIAGNOSIS — M47816 Spondylosis without myelopathy or radiculopathy, lumbar region: Secondary | ICD-10-CM | POA: Diagnosis not present

## 2022-04-17 DIAGNOSIS — Z760 Encounter for issue of repeat prescription: Secondary | ICD-10-CM | POA: Diagnosis not present

## 2022-04-17 DIAGNOSIS — M62838 Other muscle spasm: Secondary | ICD-10-CM | POA: Diagnosis not present

## 2022-04-17 DIAGNOSIS — Z79891 Long term (current) use of opiate analgesic: Secondary | ICD-10-CM | POA: Diagnosis not present

## 2022-04-17 DIAGNOSIS — G8929 Other chronic pain: Secondary | ICD-10-CM | POA: Diagnosis not present

## 2022-04-24 DIAGNOSIS — M542 Cervicalgia: Secondary | ICD-10-CM | POA: Diagnosis not present

## 2022-04-24 DIAGNOSIS — G8929 Other chronic pain: Secondary | ICD-10-CM | POA: Diagnosis not present

## 2022-04-24 DIAGNOSIS — M62838 Other muscle spasm: Secondary | ICD-10-CM | POA: Diagnosis not present

## 2022-05-06 DIAGNOSIS — H4322 Crystalline deposits in vitreous body, left eye: Secondary | ICD-10-CM | POA: Diagnosis not present

## 2022-05-06 DIAGNOSIS — H01001 Unspecified blepharitis right upper eyelid: Secondary | ICD-10-CM | POA: Diagnosis not present

## 2022-05-06 DIAGNOSIS — H43813 Vitreous degeneration, bilateral: Secondary | ICD-10-CM | POA: Diagnosis not present

## 2022-05-06 DIAGNOSIS — H524 Presbyopia: Secondary | ICD-10-CM | POA: Diagnosis not present

## 2022-05-06 DIAGNOSIS — H04123 Dry eye syndrome of bilateral lacrimal glands: Secondary | ICD-10-CM | POA: Diagnosis not present

## 2022-05-08 DIAGNOSIS — M4802 Spinal stenosis, cervical region: Secondary | ICD-10-CM | POA: Diagnosis not present

## 2022-05-08 DIAGNOSIS — M542 Cervicalgia: Secondary | ICD-10-CM | POA: Diagnosis not present

## 2022-05-08 DIAGNOSIS — M5011 Cervical disc disorder with radiculopathy,  high cervical region: Secondary | ICD-10-CM | POA: Diagnosis not present

## 2022-05-08 DIAGNOSIS — G8929 Other chronic pain: Secondary | ICD-10-CM | POA: Diagnosis not present

## 2022-05-08 DIAGNOSIS — M4722 Other spondylosis with radiculopathy, cervical region: Secondary | ICD-10-CM | POA: Diagnosis not present

## 2022-05-15 DIAGNOSIS — M62838 Other muscle spasm: Secondary | ICD-10-CM | POA: Diagnosis not present

## 2022-05-15 DIAGNOSIS — M542 Cervicalgia: Secondary | ICD-10-CM | POA: Diagnosis not present

## 2022-05-15 DIAGNOSIS — G8929 Other chronic pain: Secondary | ICD-10-CM | POA: Diagnosis not present

## 2022-05-23 DIAGNOSIS — R29890 Loss of height: Secondary | ICD-10-CM | POA: Diagnosis not present

## 2022-05-23 DIAGNOSIS — Z8489 Family history of other specified conditions: Secondary | ICD-10-CM | POA: Diagnosis not present

## 2022-05-23 DIAGNOSIS — M810 Age-related osteoporosis without current pathological fracture: Secondary | ICD-10-CM | POA: Diagnosis not present

## 2022-05-23 DIAGNOSIS — Z87442 Personal history of urinary calculi: Secondary | ICD-10-CM | POA: Diagnosis not present

## 2022-05-23 DIAGNOSIS — Z8781 Personal history of (healed) traumatic fracture: Secondary | ICD-10-CM | POA: Diagnosis not present

## 2022-05-25 DIAGNOSIS — M62838 Other muscle spasm: Secondary | ICD-10-CM | POA: Diagnosis not present

## 2022-05-25 DIAGNOSIS — G8929 Other chronic pain: Secondary | ICD-10-CM | POA: Diagnosis not present

## 2022-05-25 DIAGNOSIS — M542 Cervicalgia: Secondary | ICD-10-CM | POA: Diagnosis not present

## 2022-05-26 DIAGNOSIS — R5383 Other fatigue: Secondary | ICD-10-CM | POA: Diagnosis not present

## 2022-05-26 DIAGNOSIS — F419 Anxiety disorder, unspecified: Secondary | ICD-10-CM | POA: Diagnosis not present

## 2022-05-26 DIAGNOSIS — E559 Vitamin D deficiency, unspecified: Secondary | ICD-10-CM | POA: Diagnosis not present

## 2022-05-26 DIAGNOSIS — I10 Essential (primary) hypertension: Secondary | ICD-10-CM | POA: Diagnosis not present

## 2022-05-26 DIAGNOSIS — N390 Urinary tract infection, site not specified: Secondary | ICD-10-CM | POA: Diagnosis not present

## 2022-05-26 DIAGNOSIS — E782 Mixed hyperlipidemia: Secondary | ICD-10-CM | POA: Diagnosis not present

## 2022-05-26 DIAGNOSIS — G43909 Migraine, unspecified, not intractable, without status migrainosus: Secondary | ICD-10-CM | POA: Diagnosis not present

## 2022-06-03 DIAGNOSIS — M810 Age-related osteoporosis without current pathological fracture: Secondary | ICD-10-CM | POA: Diagnosis not present

## 2022-06-11 DIAGNOSIS — M62838 Other muscle spasm: Secondary | ICD-10-CM | POA: Diagnosis not present

## 2022-06-11 DIAGNOSIS — M47816 Spondylosis without myelopathy or radiculopathy, lumbar region: Secondary | ICD-10-CM | POA: Diagnosis not present

## 2022-06-11 DIAGNOSIS — Z79891 Long term (current) use of opiate analgesic: Secondary | ICD-10-CM | POA: Diagnosis not present

## 2022-06-11 DIAGNOSIS — Z681 Body mass index (BMI) 19 or less, adult: Secondary | ICD-10-CM | POA: Diagnosis not present

## 2022-06-11 DIAGNOSIS — M5030 Other cervical disc degeneration, unspecified cervical region: Secondary | ICD-10-CM | POA: Diagnosis not present

## 2022-06-14 DIAGNOSIS — M62838 Other muscle spasm: Secondary | ICD-10-CM | POA: Diagnosis not present

## 2022-06-14 DIAGNOSIS — G8929 Other chronic pain: Secondary | ICD-10-CM | POA: Diagnosis not present

## 2022-06-14 DIAGNOSIS — M542 Cervicalgia: Secondary | ICD-10-CM | POA: Diagnosis not present

## 2022-06-19 DIAGNOSIS — D485 Neoplasm of uncertain behavior of skin: Secondary | ICD-10-CM | POA: Diagnosis not present

## 2022-06-19 DIAGNOSIS — L650 Telogen effluvium: Secondary | ICD-10-CM | POA: Diagnosis not present

## 2022-06-19 DIAGNOSIS — L814 Other melanin hyperpigmentation: Secondary | ICD-10-CM | POA: Diagnosis not present

## 2022-06-24 DIAGNOSIS — G8929 Other chronic pain: Secondary | ICD-10-CM | POA: Diagnosis not present

## 2022-06-24 DIAGNOSIS — M62838 Other muscle spasm: Secondary | ICD-10-CM | POA: Diagnosis not present

## 2022-06-24 DIAGNOSIS — M542 Cervicalgia: Secondary | ICD-10-CM | POA: Diagnosis not present

## 2022-07-03 DIAGNOSIS — M5412 Radiculopathy, cervical region: Secondary | ICD-10-CM | POA: Diagnosis not present

## 2022-07-04 NOTE — Telephone Encounter (Signed)
It has been > 1 year since last Prolia inj.  Pt archived in [email or web address].  Please advise if patient and/or provider wish to proceed with Prolia therpay.

## 2022-07-08 NOTE — Telephone Encounter (Signed)
Patient returned call saying that she was no longer coming to this practice.

## 2022-11-24 DIAGNOSIS — G8929 Other chronic pain: Secondary | ICD-10-CM | POA: Diagnosis not present

## 2022-11-24 DIAGNOSIS — M542 Cervicalgia: Secondary | ICD-10-CM | POA: Diagnosis not present

## 2022-11-24 DIAGNOSIS — M62838 Other muscle spasm: Secondary | ICD-10-CM | POA: Diagnosis not present

## 2022-12-03 DIAGNOSIS — M47816 Spondylosis without myelopathy or radiculopathy, lumbar region: Secondary | ICD-10-CM | POA: Diagnosis not present

## 2022-12-03 DIAGNOSIS — M5440 Lumbago with sciatica, unspecified side: Secondary | ICD-10-CM | POA: Diagnosis not present

## 2022-12-03 DIAGNOSIS — M47817 Spondylosis without myelopathy or radiculopathy, lumbosacral region: Secondary | ICD-10-CM | POA: Diagnosis not present

## 2022-12-10 DIAGNOSIS — M810 Age-related osteoporosis without current pathological fracture: Secondary | ICD-10-CM | POA: Diagnosis not present

## 2022-12-17 DIAGNOSIS — Z5181 Encounter for therapeutic drug level monitoring: Secondary | ICD-10-CM | POA: Diagnosis not present

## 2022-12-17 DIAGNOSIS — M62838 Other muscle spasm: Secondary | ICD-10-CM | POA: Diagnosis not present

## 2022-12-17 DIAGNOSIS — M47817 Spondylosis without myelopathy or radiculopathy, lumbosacral region: Secondary | ICD-10-CM | POA: Diagnosis not present

## 2022-12-17 DIAGNOSIS — M5030 Other cervical disc degeneration, unspecified cervical region: Secondary | ICD-10-CM | POA: Diagnosis not present

## 2022-12-17 DIAGNOSIS — F1190 Opioid use, unspecified, uncomplicated: Secondary | ICD-10-CM | POA: Diagnosis not present

## 2022-12-17 DIAGNOSIS — Z79891 Long term (current) use of opiate analgesic: Secondary | ICD-10-CM | POA: Diagnosis not present

## 2022-12-17 DIAGNOSIS — Z681 Body mass index (BMI) 19 or less, adult: Secondary | ICD-10-CM | POA: Diagnosis not present

## 2022-12-18 DIAGNOSIS — M1990 Unspecified osteoarthritis, unspecified site: Secondary | ICD-10-CM | POA: Diagnosis not present

## 2022-12-18 DIAGNOSIS — T782XXA Anaphylactic shock, unspecified, initial encounter: Secondary | ICD-10-CM | POA: Diagnosis not present

## 2022-12-18 DIAGNOSIS — T783XXA Angioneurotic edema, initial encounter: Secondary | ICD-10-CM | POA: Diagnosis not present

## 2022-12-18 DIAGNOSIS — Z91018 Allergy to other foods: Secondary | ICD-10-CM | POA: Diagnosis not present

## 2022-12-19 DIAGNOSIS — Z91018 Allergy to other foods: Secondary | ICD-10-CM | POA: Diagnosis not present

## 2022-12-19 DIAGNOSIS — T782XXA Anaphylactic shock, unspecified, initial encounter: Secondary | ICD-10-CM | POA: Diagnosis not present

## 2022-12-19 DIAGNOSIS — T783XXA Angioneurotic edema, initial encounter: Secondary | ICD-10-CM | POA: Diagnosis not present

## 2022-12-25 DIAGNOSIS — M542 Cervicalgia: Secondary | ICD-10-CM | POA: Diagnosis not present

## 2022-12-25 DIAGNOSIS — G8929 Other chronic pain: Secondary | ICD-10-CM | POA: Diagnosis not present

## 2022-12-25 DIAGNOSIS — M62838 Other muscle spasm: Secondary | ICD-10-CM | POA: Diagnosis not present

## 2022-12-31 DIAGNOSIS — M47817 Spondylosis without myelopathy or radiculopathy, lumbosacral region: Secondary | ICD-10-CM | POA: Diagnosis not present

## 2022-12-31 DIAGNOSIS — M5440 Lumbago with sciatica, unspecified side: Secondary | ICD-10-CM | POA: Diagnosis not present

## 2023-01-24 DIAGNOSIS — G8929 Other chronic pain: Secondary | ICD-10-CM | POA: Diagnosis not present

## 2023-01-24 DIAGNOSIS — M62838 Other muscle spasm: Secondary | ICD-10-CM | POA: Diagnosis not present

## 2023-01-24 DIAGNOSIS — M542 Cervicalgia: Secondary | ICD-10-CM | POA: Diagnosis not present

## 2023-02-09 DIAGNOSIS — M47816 Spondylosis without myelopathy or radiculopathy, lumbar region: Secondary | ICD-10-CM | POA: Diagnosis not present

## 2023-02-24 DIAGNOSIS — M542 Cervicalgia: Secondary | ICD-10-CM | POA: Diagnosis not present

## 2023-02-24 DIAGNOSIS — M62838 Other muscle spasm: Secondary | ICD-10-CM | POA: Diagnosis not present

## 2023-02-24 DIAGNOSIS — G8929 Other chronic pain: Secondary | ICD-10-CM | POA: Diagnosis not present

## 2023-03-06 ENCOUNTER — Encounter: Payer: Self-pay | Admitting: Internal Medicine

## 2023-03-06 ENCOUNTER — Ambulatory Visit: Payer: Medicare PPO | Admitting: Internal Medicine

## 2023-03-06 VITALS — BP 132/74 | HR 91 | Temp 97.7°F | Resp 18 | Ht 61.0 in | Wt 101.0 lb

## 2023-03-06 DIAGNOSIS — F411 Generalized anxiety disorder: Secondary | ICD-10-CM | POA: Insufficient documentation

## 2023-03-06 DIAGNOSIS — M810 Age-related osteoporosis without current pathological fracture: Secondary | ICD-10-CM

## 2023-03-06 DIAGNOSIS — G4700 Insomnia, unspecified: Secondary | ICD-10-CM | POA: Diagnosis not present

## 2023-03-06 DIAGNOSIS — Z1231 Encounter for screening mammogram for malignant neoplasm of breast: Secondary | ICD-10-CM | POA: Diagnosis not present

## 2023-03-06 DIAGNOSIS — Z681 Body mass index (BMI) 19 or less, adult: Secondary | ICD-10-CM | POA: Diagnosis not present

## 2023-03-06 DIAGNOSIS — I10 Essential (primary) hypertension: Secondary | ICD-10-CM

## 2023-03-06 DIAGNOSIS — G894 Chronic pain syndrome: Secondary | ICD-10-CM | POA: Insufficient documentation

## 2023-03-06 MED ORDER — TRAZODONE HCL 50 MG PO TABS: 50.0000 mg | ORAL_TABLET | Freq: Every day | ORAL | 1 refills | Status: DC

## 2023-03-06 MED ORDER — ESCITALOPRAM OXALATE 20 MG PO TABS: 20.0000 mg | ORAL_TABLET | Freq: Every day | ORAL | 1 refills | Status: DC

## 2023-03-06 MED ORDER — AMLODIPINE BESYLATE 5 MG PO TABS: 5.0000 mg | ORAL_TABLET | Freq: Every day | ORAL | 1 refills | Status: DC

## 2023-03-06 MED ORDER — BUPROPION HCL ER (XL) 150 MG PO TB24: 150.0000 mg | ORAL_TABLET | Freq: Every day | ORAL | 1 refills | Status: DC

## 2023-03-06 MED ORDER — EPINEPHRINE 0.3 MG/0.3ML IJ SOAJ: 0.3000 mg | INTRAMUSCULAR | 1 refills | Status: AC | PRN

## 2023-03-06 MED ORDER — LISINOPRIL 20 MG PO TABS: 20.0000 mg | ORAL_TABLET | Freq: Every day | ORAL | 1 refills | Status: DC

## 2023-03-06 NOTE — Assessment & Plan Note (Signed)
We will continue on trazodone for sleep.

## 2023-03-06 NOTE — Assessment & Plan Note (Signed)
I have reviewed her notes from the pain clinic.  She had a recent radiofrequency ablation of her lumbar spine that has helped a lot and she has now quit her pain meds.  They are talking about doing another procedure for her neck.  She will continue to follow with them.

## 2023-03-06 NOTE — Addendum Note (Signed)
Addended by: Crist Fat on: 03/06/2023 02:40 PM   Modules accepted: Orders

## 2023-03-06 NOTE — Progress Notes (Signed)
Office Visit  Subjective   Patient ID: Alisha Becker   DOB: 29-Sep-1950   Age: 73 y.o.   MRN: [id number]   Chief Complaint Chief Complaint  Patient presents with   New Patient (Initial Visit)     History of Present Illness Alisha Becker is a 73 yo female who comes in today to establish care.  She was previously followed by Alisha Becker at [facility] where her last visit was sometime last year.  She has a past medical history of HTN, anxiety, chronic back pain, migraines and osteoporosis.    Alisha Becker is a 73 year old female has a history of chronic neck and back pain.  She states that her back and neck pain started 3-4 years ago.  She is currently followed by a pain clinic at [facility].   She complains of chronic mid line mid-lower back pain. She does report intermittent pain of the hips but denies lower extremity radicular symptoms. She denies any previous lumbar spine surgery.  She has had previous right hip injections with Orthopedics without significant benefit. She has completed physical therapy in the past without significant long-term results.  She had a lumbar spine MRI (08/19/2021) completed through [facility] that demonstrated mild disc bulge with mild facet and ligamentum prominence at L3-L4, without stenosis. She does have bilateral facet degeneration with 3 mm anterolisthesis at L4-L5 with mild bilateral lateral recess narrowing. There is facet degeneration and hypertrophy, worse on the left with left worse than right neural foraminal narrowing at L5-S1. Either L5 nerve root could be affected, more likely on the left.  She also complains of chronic cervical spine pain that radiates into her shoulders bilaterally and without upper extremity radicular symptoms. She denies any previous cervical spine surgery.  She does have a prior history of left rotator cuff repair. They did a cervical spine MRI (05/08/2022) demonstrates multilevel degenerative changes with moderate narrowing  of the right neural foramen, mild left neural foraminal narrowing at C5-C6. Lesser degenerative changes noted otherwise. No evidence of central canal stenosis noted throughout.  She underwent a lumbar diagnostic medial branch block last year and then subsequently underwent radiofrequency ablation of lumbar nerves bilaterally on 02/09/2023.  The patient was previously on hydrocodone/APAP where she started tapering off of this over a month ago and she has been completely off this medicine for 2 weeks.  She does have some pain but it is tolerable and she does not want to be on pain medication.  There is no loss of bowel/bladder function and no new weakness/numbness/tingling.    The patient is a 73 year old female who presents for a follow-up evaluation of hypertension.  She was diagnosed with HTN about 20 years ago.  The patient has been checking her blood pressure at home. The patient's blood pressure has ranged systollically 130's. The patient's current medications include: amlodipine 5mg  daily and lisinopril 20mg  daily. The patient has been tolerating her medications well. The patient denies any headache, visual changes, dizziness, lightheadness, chest pain, shortness of breath, weakness/numbness, and edema.  She reports there have been no other symptoms noted.   The patient returns for followup of her anxiety. She denies any depression at this time.  The symptoms have been present for about 20 years and she states that her anxiety is controlled.  She is on lexapro as well as wellbutrin.  The patient does have a history of insomnia where she takes trazodone which does help with sleep.   She denies any  panic attacks.  She denies any difficulty concentrating, difficulty performing routine daily activities, fatigue, extreme feelings of guilt, feelings of isolation, feelings of worthlessness, helpless feeling, hypersomnia, insomnia, loss of appetite, social withdrawal, loss of interest in pleasurable activities, out  of control feelings, and weight loss. This patient feels that she is able to care for herself. She has no predisposing factors for depression or anxiety. She currently lives by herself. She has no significant prior history of mental health disorders.   The patient is a 73 year old female, post-menopausal, who presents for followup of her osteoporosis. She was diagnosed with osteoporosis around 2009 and today she is osteopenic with her most recent bone density.  She has broke her arm in her 30's.  She denies a family history of osteoporosis. The following risk factors are noted:  none.  She denies the following: diabetes mellitus, high caffeine intake, alcohol consumption of more that 7 ounces per week, daily prednisone use, hyperthyroidism, surgical resection of her bowel, and surgical resection of her stomach. She states she exercises routinely.   Her last bone density was performed on 06/06/2020 and this showed a t-score of -2.2.  She is currently on calicum and Vit D and takes prolia every 6 months.              Past Medical History Past Medical History:  Diagnosis Date   Headache(784.0)    Hypertension      Allergies Allergies  Allergen Reactions   Macadamia Nut Oil Anaphylaxis   Aspirin Other (See Comments)    Pt has history of ulcers   Codeine Nausea Only   Latex Rash     Medications  Current Outpatient Medications:    amLODipine (NORVASC) 5 MG tablet, Take 5 mg by mouth daily., Disp: , Rfl:    Biotin 1 MG CAPS, Take 1 tablet by mouth daily., Disp: , Rfl:    buPROPion (WELLBUTRIN XL) 150 MG 24 hr tablet, TAKE 1 TABLET BY MOUTH ONCE DAILY WITH MEALS, Disp: , Rfl:    Calcium Carb-Cholecalciferol (CALCIUM 1000 + D PO), Take 1 tablet by mouth daily., Disp: , Rfl:    DHA-EPA-Vitamin E (OMEGA-3 COMPLEX PO), Take 1 tablet by mouth daily., Disp: , Rfl:    escitalopram (LEXAPRO) 20 MG tablet, Take 20 mg by mouth daily., Disp: , Rfl:    lisinopril (ZESTRIL) 20 MG tablet, Take 20 mg  by mouth daily., Disp: , Rfl:    Multiple Vitamin (MULTIVITAMIN WITH MINERALS) TABS, Take 0.5 tablets by mouth 2 (two) times daily., Disp: , Rfl:    Multiple Vitamins-Minerals (PRESERVISION AREDS 2 PO), Take 1 tablet by mouth daily., Disp: , Rfl:    Probiotic Product (PROBIOTIC-10 ULTIMATE) CAPS, Take 1 capsule by mouth daily., Disp: , Rfl:    traZODone (DESYREL) 50 MG tablet, Take 50 mg by mouth at bedtime., Disp: , Rfl:    UBRELVY 50 MG TABS, Take by mouth., Disp: , Rfl:    valACYclovir (VALTREX) 500 MG tablet, Take 500 mg by mouth daily., Disp: , Rfl:    Review of Systems Review of Systems  Constitutional:  Negative for chills, fever and malaise/fatigue.  Eyes:  Negative for blurred vision and double vision.  Respiratory:  Negative for cough and shortness of breath.   Cardiovascular:  Negative for chest pain, palpitations and leg swelling.  Gastrointestinal:  Negative for abdominal pain, constipation, diarrhea, heartburn, nausea and vomiting.  Genitourinary:  Negative for frequency.  Musculoskeletal:  Positive for back pain and neck  pain.  Skin:  Negative for itching and rash.  Neurological:  Negative for dizziness, weakness and headaches.  Endo/Heme/Allergies:  Negative for polydipsia.  Psychiatric/Behavioral:  Negative for depression and suicidal ideas. The patient has insomnia.        Objective:    Vitals BP 132/74 (BP Location: Left Arm, Patient Position: Sitting)   Pulse 91   Temp 97.7 F (36.5 C)   Resp 18   Ht 5\' 1"  (1.549 m)   Wt 101 lb (45.8 kg)   SpO2 98%   BMI 19.08 kg/m    Physical Examination Physical Exam Constitutional:      Appearance: Normal appearance. She is not ill-appearing.  Cardiovascular:     Rate and Rhythm: Normal rate and regular rhythm.     Pulses: Normal pulses.     Heart sounds: No murmur heard.    No friction rub. No gallop.  Pulmonary:     Effort: Pulmonary effort is normal. No respiratory distress.     Breath sounds: No wheezing,  rhonchi or rales.  Abdominal:     General: Bowel sounds are normal. There is no distension.     Palpations: Abdomen is soft.     Tenderness: There is no abdominal tenderness.  Musculoskeletal:     Right lower leg: No edema.     Left lower leg: No edema.  Skin:    General: Skin is warm and dry.     Findings: No rash.  Neurological:     General: No focal deficit present.     Mental Status: She is alert and oriented to person, place, and time.  Psychiatric:        Mood and Affect: Mood normal.        Behavior: Behavior normal.        Assessment & Plan:   Essential hypertension Her BP is controlled.  We will continue on amlodipine and lisinopril.  Osteoporosis She is followed by Dr. Sharl Ma in [place] for her prolia injections.  Contioue with calicum and Vit D.  She is not exercising due to her back pain.  Insomnia We will continue on trazodone for sleep.  GAD (generalized anxiety disorder) She states her GAD is mild and controlled.  We will continue on lexapro and wellbutrin XL.  Chronic pain syndrome I have reviewed her notes from the pain clinic.  She had a recent radiofrequency ablation of her lumbar spine that has helped a lot and she has now quit her pain meds.  They are talking about doing another procedure for her neck.  She will continue to follow with them.    Return in about 3 months (around 06/03/2023) for annual.   Crist Fat, MD

## 2023-03-06 NOTE — Assessment & Plan Note (Signed)
She is followed by Dr. Sharl Ma in [place] for her prolia injections.  Contioue with calicum and Vit D.  She is not exercising due to her back pain.

## 2023-03-06 NOTE — Assessment & Plan Note (Signed)
She states her GAD is mild and controlled.  We will continue on lexapro and wellbutrin XL.

## 2023-03-06 NOTE — Assessment & Plan Note (Signed)
Her BP is controlled.  We will continue on amlodipine and lisinopril.

## 2023-03-12 DIAGNOSIS — M47817 Spondylosis without myelopathy or radiculopathy, lumbosacral region: Secondary | ICD-10-CM | POA: Diagnosis not present

## 2023-03-12 DIAGNOSIS — M5030 Other cervical disc degeneration, unspecified cervical region: Secondary | ICD-10-CM | POA: Diagnosis not present

## 2023-03-12 DIAGNOSIS — S92351A Displaced fracture of fifth metatarsal bone, right foot, initial encounter for closed fracture: Secondary | ICD-10-CM | POA: Diagnosis not present

## 2023-03-12 DIAGNOSIS — M62838 Other muscle spasm: Secondary | ICD-10-CM | POA: Diagnosis not present

## 2023-03-12 DIAGNOSIS — M5412 Radiculopathy, cervical region: Secondary | ICD-10-CM | POA: Diagnosis not present

## 2023-03-12 DIAGNOSIS — F1190 Opioid use, unspecified, uncomplicated: Secondary | ICD-10-CM | POA: Diagnosis not present

## 2023-03-12 DIAGNOSIS — Z681 Body mass index (BMI) 19 or less, adult: Secondary | ICD-10-CM | POA: Diagnosis not present

## 2023-03-12 DIAGNOSIS — M79671 Pain in right foot: Secondary | ICD-10-CM | POA: Diagnosis not present

## 2023-03-12 DIAGNOSIS — M19071 Primary osteoarthritis, right ankle and foot: Secondary | ICD-10-CM | POA: Diagnosis not present

## 2023-03-13 ENCOUNTER — Ambulatory Visit: Payer: Medicare PPO | Admitting: Internal Medicine

## 2023-03-13 DIAGNOSIS — S92354A Nondisplaced fracture of fifth metatarsal bone, right foot, initial encounter for closed fracture: Secondary | ICD-10-CM | POA: Diagnosis not present

## 2023-03-27 DIAGNOSIS — M542 Cervicalgia: Secondary | ICD-10-CM | POA: Diagnosis not present

## 2023-03-27 DIAGNOSIS — M62838 Other muscle spasm: Secondary | ICD-10-CM | POA: Diagnosis not present

## 2023-03-27 DIAGNOSIS — G8929 Other chronic pain: Secondary | ICD-10-CM | POA: Diagnosis not present

## 2023-04-09 DIAGNOSIS — Z1231 Encounter for screening mammogram for malignant neoplasm of breast: Secondary | ICD-10-CM | POA: Diagnosis not present

## 2023-04-10 DIAGNOSIS — S92354A Nondisplaced fracture of fifth metatarsal bone, right foot, initial encounter for closed fracture: Secondary | ICD-10-CM | POA: Diagnosis not present

## 2023-04-13 DIAGNOSIS — Z803 Family history of malignant neoplasm of breast: Secondary | ICD-10-CM | POA: Diagnosis not present

## 2023-04-13 DIAGNOSIS — Z1231 Encounter for screening mammogram for malignant neoplasm of breast: Secondary | ICD-10-CM | POA: Diagnosis not present

## 2023-04-16 ENCOUNTER — Encounter: Payer: Self-pay | Admitting: Internal Medicine

## 2023-05-08 DIAGNOSIS — S92354A Nondisplaced fracture of fifth metatarsal bone, right foot, initial encounter for closed fracture: Secondary | ICD-10-CM | POA: Diagnosis not present

## 2023-05-13 DIAGNOSIS — M79672 Pain in left foot: Secondary | ICD-10-CM | POA: Diagnosis not present

## 2023-05-13 DIAGNOSIS — M21612 Bunion of left foot: Secondary | ICD-10-CM | POA: Diagnosis not present

## 2023-05-13 DIAGNOSIS — M205X9 Other deformities of toe(s) (acquired), unspecified foot: Secondary | ICD-10-CM | POA: Diagnosis not present

## 2023-05-19 DIAGNOSIS — R928 Other abnormal and inconclusive findings on diagnostic imaging of breast: Secondary | ICD-10-CM | POA: Diagnosis not present

## 2023-05-25 DIAGNOSIS — S92351D Displaced fracture of fifth metatarsal bone, right foot, subsequent encounter for fracture with routine healing: Secondary | ICD-10-CM | POA: Diagnosis not present

## 2023-05-25 DIAGNOSIS — M4727 Other spondylosis with radiculopathy, lumbosacral region: Secondary | ICD-10-CM | POA: Diagnosis not present

## 2023-05-25 DIAGNOSIS — M51362 Other intervertebral disc degeneration, lumbar region with discogenic back pain and lower extremity pain: Secondary | ICD-10-CM | POA: Diagnosis not present

## 2023-05-25 DIAGNOSIS — F1190 Opioid use, unspecified, uncomplicated: Secondary | ICD-10-CM | POA: Diagnosis not present

## 2023-05-25 DIAGNOSIS — M5030 Other cervical disc degeneration, unspecified cervical region: Secondary | ICD-10-CM | POA: Diagnosis not present

## 2023-05-25 DIAGNOSIS — Z6820 Body mass index (BMI) 20.0-20.9, adult: Secondary | ICD-10-CM | POA: Diagnosis not present

## 2023-05-26 DIAGNOSIS — B009 Herpesviral infection, unspecified: Secondary | ICD-10-CM | POA: Diagnosis not present

## 2023-05-26 DIAGNOSIS — L650 Telogen effluvium: Secondary | ICD-10-CM | POA: Diagnosis not present

## 2023-06-01 ENCOUNTER — Encounter: Payer: Self-pay | Admitting: Internal Medicine

## 2023-06-01 ENCOUNTER — Ambulatory Visit: Payer: Medicare PPO | Admitting: Internal Medicine

## 2023-06-01 VITALS — BP 114/68 | HR 82 | Temp 98.8°F | Resp 16 | Ht 61.0 in | Wt 106.2 lb

## 2023-06-01 DIAGNOSIS — M810 Age-related osteoporosis without current pathological fracture: Secondary | ICD-10-CM | POA: Diagnosis not present

## 2023-06-01 DIAGNOSIS — Z0000 Encounter for general adult medical examination without abnormal findings: Secondary | ICD-10-CM

## 2023-06-01 DIAGNOSIS — I10 Essential (primary) hypertension: Secondary | ICD-10-CM | POA: Diagnosis not present

## 2023-06-01 DIAGNOSIS — G43109 Migraine with aura, not intractable, without status migrainosus: Secondary | ICD-10-CM | POA: Insufficient documentation

## 2023-06-01 DIAGNOSIS — K219 Gastro-esophageal reflux disease without esophagitis: Secondary | ICD-10-CM | POA: Insufficient documentation

## 2023-06-01 DIAGNOSIS — F411 Generalized anxiety disorder: Secondary | ICD-10-CM

## 2023-06-01 DIAGNOSIS — G894 Chronic pain syndrome: Secondary | ICD-10-CM

## 2023-06-01 DIAGNOSIS — Z6820 Body mass index (BMI) 20.0-20.9, adult: Secondary | ICD-10-CM | POA: Diagnosis not present

## 2023-06-01 DIAGNOSIS — Z131 Encounter for screening for diabetes mellitus: Secondary | ICD-10-CM | POA: Diagnosis not present

## 2023-06-01 DIAGNOSIS — M150 Primary generalized (osteo)arthritis: Secondary | ICD-10-CM | POA: Insufficient documentation

## 2023-06-01 DIAGNOSIS — E7800 Pure hypercholesterolemia, unspecified: Secondary | ICD-10-CM | POA: Insufficient documentation

## 2023-06-01 DIAGNOSIS — G4700 Insomnia, unspecified: Secondary | ICD-10-CM | POA: Diagnosis not present

## 2023-06-01 MED ORDER — UBRELVY 50 MG PO TABS: ORAL_TABLET | ORAL | 1 refills | Status: DC

## 2023-06-01 MED ORDER — TRAZODONE HCL 100 MG PO TABS: 100.0000 mg | ORAL_TABLET | Freq: Every day | ORAL | 3 refills | Status: AC

## 2023-06-01 NOTE — Assessment & Plan Note (Signed)
 She sees Dr. Lamount Pimple in [place] and is scheduled to have a prolia  injection.  We will check a Vit D level today.  Continue with weight bearing exercises.

## 2023-06-01 NOTE — Assessment & Plan Note (Signed)
Her BP is well controlled.  We will continue her current BP meds.

## 2023-06-01 NOTE — Assessment & Plan Note (Signed)
 She is doing well in regards to her migraines where she has one migraine maybe every 2 months.  She will continue on ubrelvy.

## 2023-06-01 NOTE — Assessment & Plan Note (Signed)
 She controls her cholesterol with diet and exercise.  We will check a FLP today.

## 2023-06-01 NOTE — Assessment & Plan Note (Signed)
 Continue her current medicines with lexapro  and wellubtrin.  Her GAD is mild and controlled.

## 2023-06-01 NOTE — Assessment & Plan Note (Signed)
 We will continue on trazodone  at this time.

## 2023-06-01 NOTE — Progress Notes (Signed)
 Preventive Screening-Counseling & Management     Alisha Becker is a 73 y.o. female who presents for Medicare Annual/Subsequent preventive examination.  Her last eye exam was about a year ago where her vision seems to be more blurred.  She had cataract removal about 8 year ago.  She does have an eye exam coming up.  Her last digital screening mammogram was done on 04/09/2023 and this showed hetergeneously dense breasts which could obscure small masses.  There were punctate and macrocalcifications bilaterally.  She had a diagnostic mammogram and breast US  done on 05/19/2023 and this was negative.  Her last colonoscopy was done on 08/02/2014 and this showed mild diverticulosis but was otherwise normal.  The patient does exercise by walking.  The patient does not smoke.  She does get yearly flu vaccines.  She states she has had the prevnar 13 vaccine and the pneumovax 23 vaccine.  She has had the shingrix vaccine.  She has not had a RSV vaccine.  She states she has had 5-6 COVID-19 vaccines.  The patient of her parents have had CAD and strokes in the past.  She denies any memory loss today.  She is not on an ASA.   Alisha Becker is a 73 year old female has a history of chronic neck and back pain.  She states that her back and neck pain started 3-4 years ago.  She is currently followed by a pain clinic at [facility].   She complains of chronic mid line mid-lower back pain. She does report intermittent pain of the hips but denies lower extremity radicular symptoms. She denies any previous lumbar spine surgery.  She has had previous right hip injections with Orthopedics without significant benefit. She has completed physical therapy in the past without significant long-term results.  She had a lumbar spine MRI (08/19/2021) completed through [facility]ics that demonstrated mild disc bulge with mild facet and ligamentum prominence at L3-L4, without stenosis. She does have bilateral facet degeneration with 3  mm anterolisthesis at L4-L5 with mild bilateral lateral recess narrowing. There is facet degeneration and hypertrophy, worse on the left with left worse than right neural foraminal narrowing at L5-S1. Either L5 nerve root could be affected, more likely on the left.  She also complains of chronic cervical spine pain that radiates into her shoulders bilaterally and without upper extremity radicular symptoms. She denies any previous cervical spine surgery.  She does have a prior history of left rotator cuff repair. They did a cervical spine MRI (05/08/2022) demonstrates multilevel degenerative changes with moderate narrowing of the right neural foramen, mild left neural foraminal narrowing at C5-C6. Lesser degenerative changes noted otherwise. No evidence of central canal stenosis noted throughout.  She underwent a lumbar diagnostic medial branch block last year and then subsequently underwent radiofrequency ablation of lumbar nerves bilaterally on 02/09/2023.  She states she feel off the hospital bed when this was done and broke her right foot.  The patient was previously on hydrocodone/APAP where she started tapering off of this but she now takes it as needed.  There is no loss of bowel/bladder function and no new weakness/numbness/tingling.     The patient is a 73 year old female who presents for a follow-up evaluation of hypertension.  She was diagnosed with HTN about 20 years ago.  The patient has been checking her blood pressure at home. The patient's blood pressure has ranged systollically 130's. The patient's current medications include: amlodipine  5mg  daily and lisinopril  20mg  daily. The  patient has been tolerating her medications well. The patient denies any headache, visual changes, dizziness, lightheadness, chest pain, shortness of breath, weakness/numbness, and edema.  She reports there have been no other symptoms noted.   The patient also returns today for routine followup on her cholesterol. Overall,  she states she is doing well and is without any complaints or problems at this time. She specifically denies abdominal pain, nausea, vomiting, diarrhea, myalgias, and fatigue. She remains on dietary management as well as a regular exercise program and the following cholesterol lowering medications no medications. She is fasting in anticipation for labs today.    The patient returns for followup of her anxiety. She denies any depression at this time.  The symptoms have been present for about 20 years and she states that her anxiety is controlled.  She is on lexapro  as well as wellbutrin .  The patient does have a history of insomnia where she takes trazodone  100mg  (2 tabs of 50mg ) which does help with sleep.   She denies any panic attacks.  She denies any difficulty concentrating, difficulty performing routine daily activities, fatigue, extreme feelings of guilt, feelings of isolation, feelings of worthlessness, helpless feeling, hypersomnia, insomnia, loss of appetite, social withdrawal, loss of interest in pleasurable activities, out of control feelings, and weight loss. This patient feels that she is able to care for herself. She has no predisposing factors for depression or anxiety. She currently lives by herself. She has no significant prior history of mental health disorders.    The patient is a 73 year old female, post-menopausal, who presents for followup of her osteoporosis. She was diagnosed with osteoporosis around 2009 and today she is osteopenic with her most recent bone density.  She has broke her arm in her 30's.  She denies a family history of osteoporosis. The following risk factors are noted:  none.  She denies the following: diabetes mellitus, high caffeine  intake, alcohol consumption of more that 7 ounces per week, daily prednisone use, hyperthyroidism, surgical resection of her bowel, and surgical resection of her stomach. She states she exercises routinely.   Her last bone density was  performed on 06/06/2020 and this showed a t-score of -2.2.  She is currently on calicum and Vit D and takes prolia  every 6 months.    She does has a history of GERD and does get symptoms from this.  She takes OTC prilosec or nexium.  She has had a history of PUD maybe 20 years.  She denies any melena or BRBPR.  The patient is a Siebels who also has a history of migraine headaches.  She states that usually her headaches occur bitemporally and can either be a dull aching or sharp stabbing pain.  They do occur with auras.  She does get nausea with photophonophobia.  The patient states she may get one headache every 2 months.  She uses ubrelvy to break her headaches.         Are there smokers in your home (other than you)? No  Risk Factors Current exercise habits:  as above   Dietary issues discussed: none   Depression Screen (Note: if answer to either of the following is "Yes", a more complete depression screening is indicated)   Over the past two weeks, have you felt down, depressed or hopeless? No  Over the past two weeks, have you felt little interest or pleasure in doing things? No  Have you lost interest or pleasure in daily life? No  Do you  often feel hopeless? No  Do you cry easily over simple problems? No  Activities of Daily Living In your present state of health, do you have any difficulty performing the following activities?:  Driving? No Managing money?  No Feeding yourself? No Getting from bed to chair? No Climbing a flight of stairs? No Preparing food and eating?: No Bathing or showering? No Getting dressed: No Getting to the toilet? No Using the toilet:No Moving around from place to place: No In the past year have you fallen or had a near fall?:No     Hearing Difficulties: Yes Do you often ask people to speak up or repeat themselves? Yes Do you experience ringing or noises in your ears? No Do you have difficulty understanding soft or whispered voices? Yes   Do  you feel that you have a problem with memory? No  Do you often misplace items? No  Do you feel safe at home?  Yes  Cognitive Testing  Alert? Yes  Normal Appearance?Yes  Oriented to person? Yes  Place? Yes   Time? Yes  Recall of three objects?  Yes  Can perform simple calculations? Yes  Displays appropriate judgment?Yes  Can read the correct time from a watch face?Yes  SHE SCORED A 28/30 ON HER MMSE TODAY  Fall Risk Prevention  Any stairs in or around the home? No  If so, are there any without handrails? No  Home free of loose throw rugs in walkways, pet beds, electrical cords, etc? Yes  Adequate lighting in your home to reduce risk of falls? Yes  Use of a cane, walker or w/c? No    Time Up and Go  Was the test performed? No .    Gait steady and fast without use of assistive device    Advanced Directives have been discussed with the patient? Yes   List the Names of Other Physician/Practitioners you currently use: Patient Care Team: Wayne Haines, MD as PCP - General (Internal Medicine)    Past Medical History:  Diagnosis Date   Headache(784.0)    Hypertension     Past Surgical History:  Procedure Laterality Date   ABDOMINAL HYSTERECTOMY     ABDOMINOPLASTY     BLADDER SUSPENSION  08/12/2011   Procedure: TRANSVAGINAL TAPE (TVT) PROCEDURE;  Surgeon: Hamp Levine, MD;  Location: [facility];  Service: Gynecology;  Laterality: N/A;   CATARACT EXTRACTION     CHOLECYSTECTOMY     CYSTOSCOPY  08/12/2011   Procedure: CYSTOSCOPY;  Surgeon: Hamp Levine, MD;  Location: [facility];  Service: Gynecology;  Laterality: N/A;   LAPAROSCOPIC GASTROTOMY W/ REPAIR OF ULCER     ROTATOR CUFF REPAIR     VAGINAL DELIVERY     x2   WRIST FRACTURE SURGERY        Current Medications  Current Outpatient Medications  Medication Sig Dispense Refill   amLODipine  (NORVASC ) 5 MG tablet Take 1 tablet (5 mg total) by mouth daily. 90 tablet 1   Biotin 1 MG CAPS Take 1 tablet by mouth daily.      buPROPion  (WELLBUTRIN  XL) 150 MG 24 hr tablet Take 1 tablet (150 mg total) by mouth daily. 90 tablet 1   Calcium Carb-Cholecalciferol (CALCIUM 1000 + D PO) Take 1 tablet by mouth daily.     DHA-EPA-Vitamin E (OMEGA-3 COMPLEX PO) Take 1 tablet by mouth daily.     EPINEPHrine  0.3 mg/0.3 mL IJ SOAJ injection Inject 0.3 mg into the muscle as needed for anaphylaxis. 1  each 1   escitalopram  (LEXAPRO ) 20 MG tablet Take 1 tablet (20 mg total) by mouth daily. 90 tablet 1   lisinopril  (ZESTRIL ) 20 MG tablet Take 1 tablet (20 mg total) by mouth daily. 90 tablet 1   Multiple Vitamin (MULTIVITAMIN WITH MINERALS) TABS Take 0.5 tablets by mouth 2 (two) times daily.     Multiple Vitamins-Minerals (PRESERVISION AREDS 2 PO) Take 1 tablet by mouth daily.     Probiotic Product (PROBIOTIC-10 ULTIMATE) CAPS Take 1 capsule by mouth daily.     UBRELVY 50 MG TABS Take by mouth.     valACYclovir (VALTREX) 500 MG tablet Take 500 mg by mouth daily.     No current facility-administered medications for this visit.    Allergies Macadamia nut oil, Aspirin, Codeine, and Latex   Social History Social History   Tobacco Use   Smoking status: Never   Smokeless tobacco: Never  Substance Use Topics   Alcohol use: Yes    Comment: occasionally     Review of Systems Review of Systems  Constitutional:  Negative for chills, fever and malaise/fatigue.  Eyes:  Negative for blurred vision and double vision.  Respiratory:  Negative for cough, hemoptysis, shortness of breath and wheezing.   Cardiovascular:  Negative for chest pain, palpitations and leg swelling.  Gastrointestinal:  Positive for heartburn. Negative for abdominal pain, blood in stool, constipation, diarrhea, melena, nausea and vomiting.  Genitourinary:  Negative for frequency and hematuria.  Musculoskeletal:  Positive for back pain and neck pain. Negative for myalgias.  Skin:  Negative for itching and rash.  Neurological:  Negative for dizziness, weakness  and headaches.  Endo/Heme/Allergies:  Positive for polydipsia.  Psychiatric/Behavioral:  Negative for depression.      Physical Exam:      Body mass index is 20.07 kg/m. BP 114/68   Pulse 82   Temp 98.8 F (37.1 C)   Resp 16   Ht 5\' 1"  (1.549 m)   Wt 106 lb 3.2 oz (48.2 kg)   SpO2 99%   BMI 20.07 kg/m   Physical Exam Constitutional:      Appearance: Normal appearance. She is not ill-appearing.  HENT:     Head: Normocephalic and atraumatic.     Right Ear: Tympanic membrane, ear canal and external ear normal.     Left Ear: Tympanic membrane, ear canal and external ear normal.     Nose: Nose normal. No congestion or rhinorrhea.     Mouth/Throat:     Mouth: Mucous membranes are moist.     Pharynx: Oropharynx is clear. No posterior oropharyngeal erythema.  Eyes:     General: No scleral icterus.    Conjunctiva/sclera: Conjunctivae normal.     Pupils: Pupils are equal, round, and reactive to light.  Neck:     Thyroid : No thyromegaly.     Vascular: No carotid bruit.  Cardiovascular:     Rate and Rhythm: Normal rate and regular rhythm.     Pulses: Normal pulses.     Heart sounds: Normal heart sounds. No murmur heard.    No friction rub. No gallop.  Pulmonary:     Effort: Pulmonary effort is normal. No respiratory distress.     Breath sounds: Normal breath sounds. No wheezing, rhonchi or rales.  Abdominal:     General: Abdomen is flat. Bowel sounds are normal. There is no distension.     Palpations: Abdomen is soft.     Tenderness: There is no abdominal tenderness.  Musculoskeletal:  Cervical back: Normal range of motion. No tenderness.     Right lower leg: No edema.     Left lower leg: No edema.     Comments: No clubbing or cyanosis  Lymphadenopathy:     Cervical: No cervical adenopathy.  Skin:    General: Skin is warm and dry.     Findings: No rash.  Neurological:     General: No focal deficit present.     Mental Status: She is alert and oriented to person,  place, and time.     Comments: CN II-XII grossly intact  Psychiatric:        Mood and Affect: Mood normal.        Behavior: Behavior normal.      Assessment:      Osteoporosis, unspecified osteoporosis type, unspecified pathological fracture presence  Essential hypertension  Insomnia, unspecified type  GAD (generalized anxiety disorder)  Chronic pain syndrome  Primary osteoarthritis involving multiple joints  Hypercholesterolemia  Gastroesophageal reflux disease, unspecified whether esophagitis present  Migraine with aura and without status migrainosus, not intractable  BMI 20.0-20.9, adult    Plan:     During the course of the visit the patient was educated and counseled about appropriate screening and preventive services including:   Pneumococcal vaccine  Influenza vaccine Screening mammography Colorectal cancer screening Advanced directives: discussed, FULL CODE  Diet review for nutrition referral? Yes ____  Not Indicated _X___   Patient Instructions (the written plan) was given to the patient.  Migraine with aura and without status migrainosus, not intractable She is doing well in regards to her migraines where she has one migraine maybe every 2 months.  She will continue on ubrelvy.  Essential hypertension Her BP is well controlled.  We will continue her current BP meds.  Gastroesophageal reflux disease She has a history of PUD in the past.  Her GERD is controlled on her PPI.  Primary osteoarthritis involving multiple joints She will continue on hydrococone/APAP as needed for pain. She does not take any other supplmental OTC meds.  Osteoporosis She sees Dr. Lamount Pimple in [place] and is scheduled to have a prolia  injection.  We will check a Vit D level today.  Continue with weight bearing exercises.  Insomnia We will continue on trazodone  at this time.  Hypercholesterolemia She controls her cholesterol with diet and exercise.  We will check a FLP  today.  GAD (generalized anxiety disorder) Continue her current medicines with lexapro  and wellubtrin.  Her GAD is mild and controlled.  Chronic pain syndrome She is followed by the pain clinic who writes her for her hydrocodone/APAP.    BMI 20.0-20.9, adult I want her to continue to eat healthy and exercise with walking.   Prevention Health maintenance was discussed.  We will obtain some yearly labs.  Medicare Attestation I have personally reviewed: The patient's medical and social history Their use of alcohol, tobacco or illicit drugs Their current medications and supplements The patient's functional ability including ADLs,fall risks, home safety risks, cognitive, and hearing and visual impairment Diet and physical activities Evidence for depression or mood disorders  The patient's weight, height, and BMI have been recorded in the chart.  I have made referrals, counseling, and provided education to the patient based on review of the above and I have provided the patient with a written personalized care plan for preventive services.     Wayne Haines, MD   06/01/2023

## 2023-06-01 NOTE — Assessment & Plan Note (Signed)
 She has a history of PUD in the past.  Her GERD is controlled on her PPI.

## 2023-06-01 NOTE — Assessment & Plan Note (Signed)
 She is followed by the pain clinic who writes her for her hydrocodone/APAP.

## 2023-06-01 NOTE — Assessment & Plan Note (Signed)
 She will continue on hydrococone/APAP as needed for pain. She does not take any other supplmental OTC meds.

## 2023-06-01 NOTE — Assessment & Plan Note (Signed)
 I want her to continue to eat healthy and exercise with walking.

## 2023-06-02 LAB — CMP14 + ANION GAP
ALT: 20 IU/L (ref 0–32)
AST: 20 IU/L (ref 0–40)
Albumin: 4.3 g/dL (ref 3.8–4.8)
Alkaline Phosphatase: 76 IU/L (ref 44–121)
Anion Gap: 13 mmol/L (ref 10.0–18.0)
BUN/Creatinine Ratio: 33 — ABNORMAL HIGH (ref 12–28)
BUN: 24 mg/dL (ref 8–27)
Bilirubin Total: <0.2 mg/dL (ref 0.0–1.2)
CO2: 24 mmol/L (ref 20–29)
Calcium: 9.2 mg/dL (ref 8.7–10.3)
Chloride: 106 mmol/L (ref 96–106)
Creatinine, Ser: 0.72 mg/dL (ref 0.57–1.00)
Globulin, Total: 2.3 g/dL (ref 1.5–4.5)
Glucose: 87 mg/dL (ref 70–99)
Potassium: 4.1 mmol/L (ref 3.5–5.2)
Sodium: 143 mmol/L (ref 134–144)
Total Protein: 6.6 g/dL (ref 6.0–8.5)
eGFR: 88 mL/min/{1.73_m2} (ref >59)

## 2023-06-02 LAB — VITAMIN D 25 HYDROXY (VIT D DEFICIENCY, FRACTURES): Vit D, 25-Hydroxy: 47.4 ng/mL (ref 30.0–100.0)

## 2023-06-02 LAB — CBC WITH DIFFERENTIAL/PLATELET
Basophils Absolute: 0 10*3/uL (ref 0.0–0.2)
Basos: 1 %
EOS (ABSOLUTE): 0.1 10*3/uL (ref 0.0–0.4)
Eos: 2 %
Hematocrit: 40.7 % (ref 34.0–46.6)
Hemoglobin: 13.2 g/dL (ref 11.1–15.9)
Immature Grans (Abs): 0 10*3/uL (ref 0.0–0.1)
Immature Granulocytes: 0 %
Lymphocytes Absolute: 1.6 10*3/uL (ref 0.7–3.1)
Lymphs: 34 %
MCH: 30.7 pg (ref 26.6–33.0)
MCHC: 32.4 g/dL (ref 31.5–35.7)
MCV: 95 fL (ref 79–97)
Monocytes Absolute: 0.4 10*3/uL (ref 0.1–0.9)
Monocytes: 8 %
Neutrophils Absolute: 2.6 10*3/uL (ref 1.4–7.0)
Neutrophils: 55 %
Platelets: 269 10*3/uL (ref 150–450)
RBC: 4.3 x10E6/uL (ref 3.77–5.28)
RDW: 12.6 % (ref 11.7–15.4)
WBC: 4.7 10*3/uL (ref 3.4–10.8)

## 2023-06-02 LAB — LIPID PANEL
Chol/HDL Ratio: 2.2 ratio (ref 0.0–4.4)
Cholesterol, Total: 187 mg/dL (ref 100–199)
HDL: 84 mg/dL (ref >39)
LDL Chol Calc (NIH): 91 mg/dL (ref 0–99)
Triglycerides: 61 mg/dL (ref 0–149)
VLDL Cholesterol Cal: 12 mg/dL (ref 5–40)

## 2023-06-02 LAB — TSH: TSH: 5.17 u[IU]/mL — ABNORMAL HIGH (ref 0.450–4.500)

## 2023-06-02 LAB — HEMOGLOBIN A1C
Est. average glucose Bld gHb Est-mCnc: 108 mg/dL
Hgb A1c MFr Bld: 5.4 % (ref 4.8–5.6)

## 2023-06-03 ENCOUNTER — Encounter: Payer: Self-pay | Admitting: Internal Medicine

## 2023-06-08 NOTE — Progress Notes (Signed)
 Her labs look good.  Patient aware

## 2023-06-18 DIAGNOSIS — Z87442 Personal history of urinary calculi: Secondary | ICD-10-CM | POA: Diagnosis not present

## 2023-06-18 DIAGNOSIS — Z8489 Family history of other specified conditions: Secondary | ICD-10-CM | POA: Diagnosis not present

## 2023-06-18 DIAGNOSIS — M810 Age-related osteoporosis without current pathological fracture: Secondary | ICD-10-CM | POA: Diagnosis not present

## 2023-06-18 DIAGNOSIS — Z8781 Personal history of (healed) traumatic fracture: Secondary | ICD-10-CM | POA: Diagnosis not present

## 2023-06-18 DIAGNOSIS — R29890 Loss of height: Secondary | ICD-10-CM | POA: Diagnosis not present

## 2023-07-01 DIAGNOSIS — H43813 Vitreous degeneration, bilateral: Secondary | ICD-10-CM | POA: Diagnosis not present

## 2023-07-01 DIAGNOSIS — H04123 Dry eye syndrome of bilateral lacrimal glands: Secondary | ICD-10-CM | POA: Diagnosis not present

## 2023-07-01 DIAGNOSIS — H524 Presbyopia: Secondary | ICD-10-CM | POA: Diagnosis not present

## 2023-07-01 DIAGNOSIS — H0100A Unspecified blepharitis right eye, upper and lower eyelids: Secondary | ICD-10-CM | POA: Diagnosis not present

## 2023-07-01 DIAGNOSIS — H52203 Unspecified astigmatism, bilateral: Secondary | ICD-10-CM | POA: Diagnosis not present

## 2023-07-01 DIAGNOSIS — H4322 Crystalline deposits in vitreous body, left eye: Secondary | ICD-10-CM | POA: Diagnosis not present

## 2023-07-01 DIAGNOSIS — H0100B Unspecified blepharitis left eye, upper and lower eyelids: Secondary | ICD-10-CM | POA: Diagnosis not present

## 2023-07-21 DIAGNOSIS — M62838 Other muscle spasm: Secondary | ICD-10-CM | POA: Diagnosis not present

## 2023-07-21 DIAGNOSIS — M51362 Other intervertebral disc degeneration, lumbar region with discogenic back pain and lower extremity pain: Secondary | ICD-10-CM | POA: Diagnosis not present

## 2023-07-21 DIAGNOSIS — M5030 Other cervical disc degeneration, unspecified cervical region: Secondary | ICD-10-CM | POA: Diagnosis not present

## 2023-07-21 DIAGNOSIS — Z6820 Body mass index (BMI) 20.0-20.9, adult: Secondary | ICD-10-CM | POA: Diagnosis not present

## 2023-07-21 DIAGNOSIS — F1190 Opioid use, unspecified, uncomplicated: Secondary | ICD-10-CM | POA: Diagnosis not present

## 2023-07-21 DIAGNOSIS — M4727 Other spondylosis with radiculopathy, lumbosacral region: Secondary | ICD-10-CM | POA: Diagnosis not present

## 2023-07-21 DIAGNOSIS — Z5181 Encounter for therapeutic drug level monitoring: Secondary | ICD-10-CM | POA: Diagnosis not present

## 2023-07-27 ENCOUNTER — Other Ambulatory Visit: Payer: Self-pay

## 2023-07-27 MED ORDER — UBRELVY 50 MG PO TABS: ORAL_TABLET | ORAL | 1 refills | Status: AC

## 2023-07-27 NOTE — Progress Notes (Signed)
 Rx refill

## 2023-08-06 DIAGNOSIS — M51362 Other intervertebral disc degeneration, lumbar region with discogenic back pain and lower extremity pain: Secondary | ICD-10-CM | POA: Diagnosis not present

## 2023-08-06 DIAGNOSIS — M4727 Other spondylosis with radiculopathy, lumbosacral region: Secondary | ICD-10-CM | POA: Diagnosis not present

## 2023-08-06 DIAGNOSIS — M545 Low back pain, unspecified: Secondary | ICD-10-CM | POA: Diagnosis not present

## 2023-08-06 DIAGNOSIS — M5416 Radiculopathy, lumbar region: Secondary | ICD-10-CM | POA: Diagnosis not present

## 2023-08-28 ENCOUNTER — Encounter: Payer: Self-pay | Admitting: Internal Medicine

## 2023-08-28 ENCOUNTER — Ambulatory Visit: Admitting: Internal Medicine

## 2023-08-28 VITALS — BP 110/62 | HR 66 | Temp 98.2°F | Resp 17 | Ht 61.0 in | Wt 106.2 lb

## 2023-08-28 DIAGNOSIS — I10 Essential (primary) hypertension: Secondary | ICD-10-CM

## 2023-08-28 DIAGNOSIS — M150 Primary generalized (osteo)arthritis: Secondary | ICD-10-CM | POA: Diagnosis not present

## 2023-08-28 DIAGNOSIS — G894 Chronic pain syndrome: Secondary | ICD-10-CM | POA: Diagnosis not present

## 2023-08-28 MED ORDER — ESCITALOPRAM OXALATE 20 MG PO TABS: 20.0000 mg | ORAL_TABLET | Freq: Every day | ORAL | 1 refills | Status: DC

## 2023-08-28 MED ORDER — BUPROPION HCL ER (XL) 150 MG PO TB24: 150.0000 mg | ORAL_TABLET | Freq: Every day | ORAL | 1 refills | Status: DC

## 2023-08-28 MED ORDER — LISINOPRIL 20 MG PO TABS: 20.0000 mg | ORAL_TABLET | Freq: Every day | ORAL | 1 refills | Status: DC

## 2023-08-28 NOTE — Progress Notes (Signed)
 Office Visit  Subjective   Patient ID: Alisha Becker   DOB: [birth year]   Age: 73 y.o.   MRN: [id number]   Chief Complaint Chief Complaint  Patient presents with   Follow-up     History of Present Illness Alisha Becker is a 73 year old female has a history of chronic neck and back pain.  She states that her back and neck pain started 3-4 years ago.  She is currently followed by a pain clinic at [facility].   She has chronic mid line mid-lower back pain. She does report intermittent pain of the hips but denies lower extremity radicular symptoms. She denies any previous lumbar spine surgery.  She has had previous right hip injections with Orthopedics without significant benefit. She has completed physical therapy in the past without significant long-term results.  She had a lumbar spine MRI (08/19/2021) completed through [facility] orthopedics that demonstrated mild disc bulge with mild facet and ligamentum prominence at L3-L4, without stenosis. She does have bilateral facet degeneration with 3 mm anterolisthesis at L4-L5 with mild bilateral lateral recess narrowing. There is facet degeneration and hypertrophy, worse on the left with left worse than right neural foraminal narrowing at L5-S1. Either L5 nerve root could be affected, more likely on the left.  She also complains of chronic cervical spine pain that radiates into her shoulders bilaterally and without upper extremity radicular symptoms. She denies any previous cervical spine surgery.  She does have a prior history of left rotator cuff repair. They did a cervical spine MRI (05/08/2022) demonstrates multilevel degenerative changes with moderate narrowing of the right neural foramen, mild left neural foraminal narrowing at C5-C6. Lesser degenerative changes noted otherwise. No evidence of central canal stenosis noted throughout.  She underwent a lumbar diagnostic medial branch block last year and then subsequently underwent radiofrequency ablation  of lumbar nerves bilaterally on 02/09/2023.  She states she feel off the hospital bed when this was done and broke her right foot.  The patient was previously on hydrocodone/APAP where she started tapering off of this but she now takes it as needed.  She did see the pain clinic on 07/21/2023 where she continued to struggle with chronic lower back pain that radiatese into her hips and buttocks bilaterally.  They referred her for a lumbar ESI which was done on 08/06/2023.  She states that since this ESI her pain is doing a lot of better.  She states 90% of her pain is gone.  There is no loss of bowel/bladder function and no new weakness/numbness/tingling.     The patient is a 73 year old female who presents for a follow-up evaluation of hypertension.  She was diagnosed with HTN about 20 years ago.  Over the interim, she states her BP has been running low where she will get dizzy.  This started about 6 months ago.  The patient has not been checking her blood pressure at home.  The patient's current medications include: amlodipine  5mg  daily and lisinopril  20mg  daily. The patient has been tolerating her medications well. The patient denies any headache, visual changes, dizziness, lightheadness, chest pain, shortness of breath, weakness/numbness, and edema.  She reports there have been no other symptoms noted.       Past Medical History Past Medical History:  Diagnosis Date   Headache(784.0)    Hypertension      Allergies Allergies  Allergen Reactions   Macadamia Nut Oil Anaphylaxis   Aspirin Other (See Comments)    Pt has  history of ulcers   Codeine Nausea Only   Latex Rash     Medications  Current Outpatient Medications:    Biotin 1 MG CAPS, Take 1 tablet by mouth daily., Disp: , Rfl:    buPROPion  (WELLBUTRIN  XL) 150 MG 24 hr tablet, Take 1 tablet (150 mg total) by mouth daily., Disp: 90 tablet, Rfl: 1   Calcium Carb-Cholecalciferol (CALCIUM 1000 + D PO), Take 1 tablet by mouth daily., Disp: ,  Rfl:    DHA-EPA-Vitamin E (OMEGA-3 COMPLEX PO), Take 1 tablet by mouth daily., Disp: , Rfl:    EPINEPHrine  0.3 mg/0.3 mL IJ SOAJ injection, Inject 0.3 mg into the muscle as needed for anaphylaxis., Disp: 1 each, Rfl: 1   escitalopram  (LEXAPRO ) 20 MG tablet, Take 1 tablet (20 mg total) by mouth daily., Disp: 90 tablet, Rfl: 1   lisinopril  (ZESTRIL ) 20 MG tablet, Take 1 tablet (20 mg total) by mouth daily., Disp: 90 tablet, Rfl: 1   Multiple Vitamin (MULTIVITAMIN WITH MINERALS) TABS, Take 0.5 tablets by mouth 2 (two) times daily., Disp: , Rfl:    Multiple Vitamins-Minerals (PRESERVISION AREDS 2 PO), Take 1 tablet by mouth daily., Disp: , Rfl:    Probiotic Product (PROBIOTIC-10 ULTIMATE) CAPS, Take 1 capsule by mouth daily., Disp: , Rfl:    traZODone  (DESYREL ) 100 MG tablet, Take 1 tablet (100 mg total) by mouth at bedtime., Disp: 90 tablet, Rfl: 3   UBRELVY  50 MG TABS, Take one tab po at onset of migraine.  If symptoms persist, a second dose may be taken in 2 hours.  Do not exceed 2 doses in a 24 hour period., Disp: 30 tablet, Rfl: 1   valACYclovir (VALTREX) 500 MG tablet, Take 500 mg by mouth daily., Disp: , Rfl:    Review of Systems Review of Systems  Constitutional:  Negative for chills, fever, malaise/fatigue and weight loss.  Eyes:  Negative for blurred vision and double vision.  Respiratory:  Negative for cough and shortness of breath.   Cardiovascular:  Negative for chest pain, palpitations and leg swelling.  Gastrointestinal:  Negative for abdominal pain, constipation, diarrhea, nausea and vomiting.  Musculoskeletal:  Negative for myalgias.  Skin:  Negative for itching and rash.  Neurological:  Negative for dizziness, weakness and headaches.       Objective:    Vitals BP 110/62   Pulse 66   Temp 98.2 F (36.8 C)   Resp 17   Ht 5' 1 (1.549 m)   Wt 106 lb 3.2 oz (48.2 kg)   SpO2 96%   BMI 20.07 kg/m    Physical Examination Physical Exam Constitutional:      Appearance:  Normal appearance. She is not ill-appearing.  Cardiovascular:     Rate and Rhythm: Normal rate and regular rhythm.     Pulses: Normal pulses.     Heart sounds: No murmur heard.    No friction rub. No gallop.  Pulmonary:     Effort: Pulmonary effort is normal. No respiratory distress.     Breath sounds: No wheezing, rhonchi or rales.  Abdominal:     General: Bowel sounds are normal. There is no distension.     Palpations: Abdomen is soft.     Tenderness: There is no abdominal tenderness.  Musculoskeletal:     Right lower leg: No edema.     Left lower leg: No edema.  Skin:    General: Skin is warm and dry.     Findings: No rash.  Neurological:  Mental Status: She is alert.        Assessment & Plan:   Essential hypertension Her BP is low today but she is not having any symptoms.  I am going to discontinue her amlodipine  and just continue on with lisinopril .  We will recheck her BP in 3 months.  Chronic pain syndrome She has both cervical and lumbar back pain where her lumbar ESI has helped a lot.  She states her cervical pain is usually a 4 and she treats this with heat and tylenol .  She will followup with the pain clinic where they are discussing doing an ESI of her cervical spine.    Return in about 3 months (around 11/28/2023).   Selinda Fleeta Finger, MD

## 2023-08-28 NOTE — Assessment & Plan Note (Signed)
 Her BP is low today but she is not having any symptoms.  I am going to discontinue her amlodipine  and just continue on with lisinopril .  We will recheck her BP in 3 months.

## 2023-08-28 NOTE — Assessment & Plan Note (Signed)
 She has both cervical and lumbar back pain where her lumbar ESI has helped a lot.  She states her cervical pain is usually a 4 and she treats this with heat and tylenol .  She will followup with the pain clinic where they are discussing doing an ESI of her cervical spine.

## 2023-09-07 DIAGNOSIS — M62838 Other muscle spasm: Secondary | ICD-10-CM | POA: Diagnosis not present

## 2023-09-07 DIAGNOSIS — F1190 Opioid use, unspecified, uncomplicated: Secondary | ICD-10-CM | POA: Diagnosis not present

## 2023-09-07 DIAGNOSIS — M4727 Other spondylosis with radiculopathy, lumbosacral region: Secondary | ICD-10-CM | POA: Diagnosis not present

## 2023-09-07 DIAGNOSIS — Z6820 Body mass index (BMI) 20.0-20.9, adult: Secondary | ICD-10-CM | POA: Diagnosis not present

## 2023-09-07 DIAGNOSIS — M51362 Other intervertebral disc degeneration, lumbar region with discogenic back pain and lower extremity pain: Secondary | ICD-10-CM | POA: Diagnosis not present

## 2023-10-01 DIAGNOSIS — G44229 Chronic tension-type headache, not intractable: Secondary | ICD-10-CM | POA: Diagnosis not present

## 2023-10-01 DIAGNOSIS — Z6820 Body mass index (BMI) 20.0-20.9, adult: Secondary | ICD-10-CM | POA: Diagnosis not present

## 2023-10-01 DIAGNOSIS — M4727 Other spondylosis with radiculopathy, lumbosacral region: Secondary | ICD-10-CM | POA: Diagnosis not present

## 2023-10-01 DIAGNOSIS — M5030 Other cervical disc degeneration, unspecified cervical region: Secondary | ICD-10-CM | POA: Diagnosis not present

## 2023-10-01 DIAGNOSIS — M51362 Other intervertebral disc degeneration, lumbar region with discogenic back pain and lower extremity pain: Secondary | ICD-10-CM | POA: Diagnosis not present

## 2023-10-01 DIAGNOSIS — M791 Myalgia, unspecified site: Secondary | ICD-10-CM | POA: Diagnosis not present

## 2023-10-01 DIAGNOSIS — F1190 Opioid use, unspecified, uncomplicated: Secondary | ICD-10-CM | POA: Diagnosis not present

## 2023-10-13 ENCOUNTER — Other Ambulatory Visit: Payer: Self-pay | Admitting: Internal Medicine

## 2023-10-22 DIAGNOSIS — M51362 Other intervertebral disc degeneration, lumbar region with discogenic back pain and lower extremity pain: Secondary | ICD-10-CM | POA: Diagnosis not present

## 2023-10-22 DIAGNOSIS — G44229 Chronic tension-type headache, not intractable: Secondary | ICD-10-CM | POA: Diagnosis not present

## 2023-10-22 DIAGNOSIS — F1190 Opioid use, unspecified, uncomplicated: Secondary | ICD-10-CM | POA: Diagnosis not present

## 2023-10-22 DIAGNOSIS — M5412 Radiculopathy, cervical region: Secondary | ICD-10-CM | POA: Diagnosis not present

## 2023-10-22 DIAGNOSIS — M5030 Other cervical disc degeneration, unspecified cervical region: Secondary | ICD-10-CM | POA: Diagnosis not present

## 2023-10-22 DIAGNOSIS — M791 Myalgia, unspecified site: Secondary | ICD-10-CM | POA: Diagnosis not present

## 2023-10-22 DIAGNOSIS — Z681 Body mass index (BMI) 19 or less, adult: Secondary | ICD-10-CM | POA: Diagnosis not present

## 2023-11-25 DIAGNOSIS — L650 Telogen effluvium: Secondary | ICD-10-CM | POA: Diagnosis not present

## 2023-11-27 ENCOUNTER — Encounter: Payer: Self-pay | Admitting: Internal Medicine

## 2023-11-27 ENCOUNTER — Ambulatory Visit: Admitting: Internal Medicine

## 2023-11-27 VITALS — BP 114/60 | HR 86 | Temp 97.2°F | Resp 16 | Ht 61.0 in | Wt 107.6 lb

## 2023-11-27 DIAGNOSIS — G894 Chronic pain syndrome: Secondary | ICD-10-CM

## 2023-11-27 DIAGNOSIS — B3731 Acute candidiasis of vulva and vagina: Secondary | ICD-10-CM | POA: Diagnosis not present

## 2023-11-27 DIAGNOSIS — I10 Essential (primary) hypertension: Secondary | ICD-10-CM

## 2023-11-27 DIAGNOSIS — Z23 Encounter for immunization: Secondary | ICD-10-CM | POA: Diagnosis not present

## 2023-11-27 DIAGNOSIS — E7800 Pure hypercholesterolemia, unspecified: Secondary | ICD-10-CM

## 2023-11-27 LAB — POCT URINALYSIS DIPSTICK
Bilirubin, UA: NEGATIVE
Blood, UA: NEGATIVE
Glucose, UA: NEGATIVE
Ketones, UA: NEGATIVE
Leukocytes, UA: NEGATIVE
Nitrite, UA: NEGATIVE
Protein, UA: NEGATIVE
Spec Grav, UA: <=1.005 — AB (ref 1.010–1.025)
Urobilinogen, UA: 0.2 U/dL
pH, UA: 6 (ref 5.0–8.0)

## 2023-11-27 MED ORDER — BUPROPION HCL ER (XL) 150 MG PO TB24: 150.0000 mg | ORAL_TABLET | Freq: Every day | ORAL | 1 refills | Status: AC

## 2023-11-27 MED ORDER — FLUCONAZOLE 150 MG PO TABS: 150.0000 mg | ORAL_TABLET | Freq: Once | ORAL | 0 refills | Status: AC

## 2023-11-27 MED ORDER — AMLODIPINE BESYLATE 5 MG PO TABS: 5.0000 mg | ORAL_TABLET | Freq: Every day | ORAL | 3 refills | Status: AC

## 2023-11-27 MED ORDER — ESCITALOPRAM OXALATE 20 MG PO TABS: 20.0000 mg | ORAL_TABLET | Freq: Every day | ORAL | 1 refills | Status: AC

## 2023-11-27 MED ORDER — LISINOPRIL 20 MG PO TABS: 20.0000 mg | ORAL_TABLET | Freq: Every day | ORAL | 1 refills | Status: AC

## 2023-11-27 NOTE — Assessment & Plan Note (Signed)
 I have reviewed her notes from pain clinic.  She seems to be doing well on her butrans patch and prn hydrocodone

## 2023-11-27 NOTE — Assessment & Plan Note (Signed)
Her BP is currently controlled.  We will continue her current medications.

## 2023-11-27 NOTE — Progress Notes (Signed)
 Office Visit  Subjective   Patient ID: Alisha Becker   DOB: [birth year]   Age: 73 y.o.   MRN: [id number]   Chief Complaint Chief Complaint  Patient presents with   Follow-up    3 Month follow up     History of Present Illness The patient is a 73 year old female who presents for a follow-up evaluation of hypertension.  She was diagnosed with HTN about 20 years ago.  The patient has not been checking her blood pressure at home. The patient's current medications include: amlodipine  5mg  daily and lisinopril  20mg  daily. The patient has been tolerating her medications well. The patient denies any headache, visual changes, dizziness, lightheadness, chest pain, shortness of breath, weakness/numbness, and edema.  She reports there have been no other symptoms noted.    The patient also returns today for routine followup on her cholesterol. Overall, she states she is doing well and is without any complaints or problems at this time. She specifically denies abdominal pain, nausea, vomiting, diarrhea, myalgias, and fatigue. She remains on dietary management as well as a regular exercise program and the following cholesterol lowering medications no medications. She is fasting in anticipation for labs today.   Alisha Becker is a 73 year old female also has a history of chronic neck and back pain.  She states that her back and neck pain started 3-4 years ago.  She is currently followed by a pain clinic at [facility].  They were taking about doing another ESI of her neck however the patient tells me that they started her on a butrans patch 5mcg weekly and are using hydrocodone 5/325 mg twice daily as needed for severe breakthrough pain and baclofen 10 mg 3 times daily as needed. Since starting the butrans patch, her pain has significantly improved her pain. Today, she states that on average her meds keep her pain at a scale of 2 out of 10 on a pain scale.  She has chronic mid line mid-lower back pain. She does  report intermittent pain of the hips but denies lower extremity radicular symptoms. She denies any previous lumbar spine surgery.  She has had previous right hip injections with Orthopedics without significant benefit. She has completed physical therapy in the past without significant long-term results.  She had a lumbar spine MRI (08/19/2021) completed through [facility] orthopedics that demonstrated mild disc bulge with mild facet and ligamentum prominence at L3-L4, without stenosis. She does have bilateral facet degeneration with 3 mm anterolisthesis at L4-L5 with mild bilateral lateral recess narrowing. There is facet degeneration and hypertrophy, worse on the left with left worse than right neural foraminal narrowing at L5-S1. Either L5 nerve root could be affected, more likely on the left.  She also complains of chronic cervical spine pain that radiates into her shoulders bilaterally and without upper extremity radicular symptoms. She denies any previous cervical spine surgery.  She does have a prior history of left rotator cuff repair. They did a cervical spine MRI (05/08/2022) demonstrates multilevel degenerative changes with moderate narrowing of the right neural foramen, mild left neural foraminal narrowing at C5-C6. Lesser degenerative changes noted otherwise. No evidence of central canal stenosis noted throughout.  She underwent a lumbar diagnostic medial branch block last year and then subsequently underwent radiofrequency ablation of lumbar nerves bilaterally on 02/09/2023.  She states she feel off the hospital bed when this was done and broke her right foot.  The patient was previously on hydrocodone/APAP where she started tapering off  of this but she now takes it as needed.  She did see the pain clinic on 07/21/2023 where she continued to struggle with chronic lower back pain that radiatese into her hips and buttocks bilaterally.  They referred her for a lumbar ESI which was done on 08/06/2023.  She states  that since this ESI her pain is doing a lot of better.  She states 90% of her pain is gone.  There is no loss of bowel/bladder function and no new weakness/numbness/tingling.    She also states she is having increased urinary frequency and itching.  There is no hematuria, dysuria, nausea, vomiting, or abdominal pain, or fevers, chills.       Past Medical History Past Medical History:  Diagnosis Date   Headache(784.0)    Hypertension      Allergies Allergies  Allergen Reactions   Macadamia Nut Oil Anaphylaxis   Aspirin Other (See Comments)    Pt has history of ulcers   Codeine Nausea Only   Latex Rash     Medications  Current Outpatient Medications:    amLODipine  (NORVASC ) 5 MG tablet, Take 1 tablet by mouth once daily, Disp: 90 tablet, Rfl: 0   Biotin 1 MG CAPS, Take 1 tablet by mouth daily., Disp: , Rfl:    buPROPion  (WELLBUTRIN  XL) 150 MG 24 hr tablet, Take 1 tablet (150 mg total) by mouth daily., Disp: 90 tablet, Rfl: 1   Calcium Carb-Cholecalciferol (CALCIUM 1000 + D PO), Take 1 tablet by mouth daily., Disp: , Rfl:    DHA-EPA-Vitamin E (OMEGA-3 COMPLEX PO), Take 1 tablet by mouth daily., Disp: , Rfl:    EPINEPHrine  0.3 mg/0.3 mL IJ SOAJ injection, Inject 0.3 mg into the muscle as needed for anaphylaxis., Disp: 1 each, Rfl: 1   escitalopram  (LEXAPRO ) 20 MG tablet, Take 1 tablet (20 mg total) by mouth daily., Disp: 90 tablet, Rfl: 1   lisinopril  (ZESTRIL ) 20 MG tablet, Take 1 tablet (20 mg total) by mouth daily., Disp: 90 tablet, Rfl: 1   Multiple Vitamin (MULTIVITAMIN WITH MINERALS) TABS, Take 0.5 tablets by mouth 2 (two) times daily., Disp: , Rfl:    Probiotic Product (PROBIOTIC-10 ULTIMATE) CAPS, Take 1 capsule by mouth daily., Disp: , Rfl:    traZODone  (DESYREL ) 100 MG tablet, Take 1 tablet (100 mg total) by mouth at bedtime., Disp: 90 tablet, Rfl: 3   UBRELVY  50 MG TABS, Take one tab po at onset of migraine.  If symptoms persist, a second dose may be taken in 2 hours.  Do  not exceed 2 doses in a 24 hour period., Disp: 30 tablet, Rfl: 1   valACYclovir (VALTREX) 500 MG tablet, Take 500 mg by mouth daily., Disp: , Rfl:    buprenorphine (BUTRANS) 5 MCG/HR PTWK, Place 1 patch onto the skin once a week., Disp: , Rfl:    Review of Systems Review of Systems  Constitutional:  Negative for chills, fever and malaise/fatigue.  Eyes:  Negative for blurred vision and double vision.  Respiratory:  Negative for cough and shortness of breath.   Cardiovascular:  Negative for chest pain, palpitations and leg swelling.  Gastrointestinal:  Negative for abdominal pain, constipation, diarrhea, heartburn, nausea and vomiting.  Genitourinary:  Positive for frequency. Negative for dysuria, flank pain, hematuria and urgency.  Musculoskeletal:  Negative for myalgias.  Skin:  Negative for itching and rash.  Neurological:  Negative for dizziness, weakness and headaches.  Endo/Heme/Allergies:  Positive for polydipsia.       Objective:  Vitals BP 114/60   Pulse 86   Temp (!) 97.2 F (36.2 C) (Temporal)   Resp 16   Ht 5' 1 (1.549 m)   Wt 107 lb 9.6 oz (48.8 kg)   SpO2 96%   BMI 20.33 kg/m    Physical Examination Physical Exam Constitutional:      Appearance: Normal appearance. She is not ill-appearing.  Cardiovascular:     Rate and Rhythm: Normal rate and regular rhythm.     Pulses: Normal pulses.     Heart sounds: No murmur heard.    No friction rub. No gallop.  Pulmonary:     Effort: Pulmonary effort is normal. No respiratory distress.     Breath sounds: No wheezing, rhonchi or rales.  Abdominal:     General: Bowel sounds are normal. There is no distension.     Palpations: Abdomen is soft.     Tenderness: There is no abdominal tenderness.  Musculoskeletal:     Right lower leg: No edema.     Left lower leg: No edema.  Skin:    General: Skin is warm and dry.     Findings: No rash.  Neurological:     General: No focal deficit present.     Mental Status: She  is alert and oriented to person, place, and time.  Psychiatric:        Mood and Affect: Mood normal.        Behavior: Behavior normal.        Assessment & Plan:   Essential hypertension Her BP is currently controlled.  We will continue her current medications.  Vaginal yeast infection We did a UA and this was normal.  We will treat her with diflucan.  Hypercholesterolemia We will continue her on diet and exercise for her choelsterol.  Chronic pain syndrome I have reviewed her notes from pain clinic.  She seems to be doing well on her butrans patch and prn hydrocodone    Return in about 3 months (around 02/27/2024).   Selinda Fleeta Finger, MD

## 2023-11-27 NOTE — Assessment & Plan Note (Signed)
 We will continue her on diet and exercise for her choelsterol.

## 2023-11-27 NOTE — Assessment & Plan Note (Signed)
 We did a UA and this was normal.  We will treat her with diflucan.

## 2023-11-30 NOTE — Addendum Note (Signed)
 Addended by: LENETTA LACKS on: 11/30/2023 01:33 PM   Modules accepted: Orders

## 2023-12-18 DIAGNOSIS — M810 Age-related osteoporosis without current pathological fracture: Secondary | ICD-10-CM | POA: Diagnosis not present

## 2023-12-30 ENCOUNTER — Telehealth (HOSPITAL_COMMUNITY): Payer: Self-pay | Admitting: Pharmacy Technician

## 2023-12-30 ENCOUNTER — Other Ambulatory Visit (HOSPITAL_COMMUNITY): Payer: Self-pay | Admitting: Internal Medicine

## 2023-12-30 NOTE — Telephone Encounter (Signed)
 Auth Submission: NO AUTH NEEDED Site of care: [facility] Payer: [employer]IC[employer] Medication & CPT/J Code(s) submitted: Reclast (Zolendronic acid) [id number] Diagnosis Code: M81.0 Route of submission (phone, fax, portal):  Phone # Fax # Auth type: Buy/Bill HB Units/visits requested: 5MG  X 1 DOSE, EVERY 12 MONTHS Reference number:  Approval from: 12/30/2023 to 03/05/24    Dagoberto Armour, CPhT [facility] Phone: [phone] 12/30/2023

## 2024-02-23 ENCOUNTER — Ambulatory Visit (HOSPITAL_COMMUNITY)
Admission: RE | Admit: 2024-02-23 | Discharge: 2024-02-23 | Disposition: A | Discharge status: Home / Self Care | Source: Ambulatory Visit | Attending: Internal Medicine | Admitting: Internal Medicine

## 2024-02-23 VITALS — BP 139/64 | HR 65 | Temp 98.0°F | Resp 16

## 2024-02-23 DIAGNOSIS — M810 Age-related osteoporosis without current pathological fracture: Secondary | ICD-10-CM | POA: Diagnosis present

## 2024-02-23 DIAGNOSIS — Z7983 Long term (current) use of bisphosphonates: Secondary | ICD-10-CM | POA: Insufficient documentation

## 2024-02-23 MED ORDER — ACETAMINOPHEN 325 MG PO TABS
650.0000 mg | ORAL_TABLET | Freq: Once | ORAL | Status: AC
  Administered 2024-02-23: 650 mg via ORAL

## 2024-02-23 MED ORDER — DIPHENHYDRAMINE HCL 25 MG PO CAPS: 25.0000 mg | ORAL_CAPSULE | Freq: Once | ORAL | Status: DC

## 2024-02-23 MED ORDER — ZOLEDRONIC ACID 5 MG/100ML IV SOLN
INTRAVENOUS | Status: AC
  Filled 2024-02-23: qty 100

## 2024-02-23 MED ORDER — ACETAMINOPHEN 325 MG PO TABS
ORAL_TABLET | ORAL | Status: AC
  Filled 2024-02-23: qty 2

## 2024-02-23 MED ORDER — SODIUM CHLORIDE 0.9 % IV SOLN: INTRAVENOUS | Status: DC

## 2024-02-23 MED ORDER — ZOLEDRONIC ACID 5 MG/100ML IV SOLN
5.0000 mg | Freq: Once | INTRAVENOUS | Status: AC
  Administered 2024-02-23: 5 mg via INTRAVENOUS

## 2024-02-26 ENCOUNTER — Ambulatory Visit: Admitting: Internal Medicine
